# Patient Record
Sex: Female | Born: 1975 | Race: Black or African American | Hispanic: No | Marital: Single | Smoking: Current some day smoker
Health system: Southern US, Community
[De-identification: ages and names within clinical notes are randomized; demographics above are authoritative.]

## PROBLEM LIST (undated history)

## (undated) DIAGNOSIS — IMO0002 Reserved for concepts with insufficient information to code with codable children: Secondary | ICD-10-CM

## (undated) DIAGNOSIS — F32A Depression, unspecified: Secondary | ICD-10-CM

## (undated) DIAGNOSIS — I10 Essential (primary) hypertension: Secondary | ICD-10-CM

## (undated) DIAGNOSIS — E559 Vitamin D deficiency, unspecified: Secondary | ICD-10-CM

## (undated) DIAGNOSIS — R638 Other symptoms and signs concerning food and fluid intake: Secondary | ICD-10-CM

## (undated) DIAGNOSIS — E119 Type 2 diabetes mellitus without complications: Secondary | ICD-10-CM

## (undated) DIAGNOSIS — K219 Gastro-esophageal reflux disease without esophagitis: Secondary | ICD-10-CM

## (undated) DIAGNOSIS — Z8742 Personal history of other diseases of the female genital tract: Secondary | ICD-10-CM

## (undated) DIAGNOSIS — R011 Cardiac murmur, unspecified: Secondary | ICD-10-CM

## (undated) DIAGNOSIS — R519 Headache, unspecified: Secondary | ICD-10-CM

## (undated) DIAGNOSIS — G4730 Sleep apnea, unspecified: Secondary | ICD-10-CM

## (undated) DIAGNOSIS — R51 Headache: Secondary | ICD-10-CM

## (undated) DIAGNOSIS — E7800 Pure hypercholesterolemia, unspecified: Secondary | ICD-10-CM

## (undated) DIAGNOSIS — Z86718 Personal history of other venous thrombosis and embolism: Secondary | ICD-10-CM

## (undated) DIAGNOSIS — N809 Endometriosis, unspecified: Secondary | ICD-10-CM

## (undated) DIAGNOSIS — F329 Major depressive disorder, single episode, unspecified: Secondary | ICD-10-CM

## (undated) DIAGNOSIS — F3131 Bipolar disorder, current episode depressed, mild: Secondary | ICD-10-CM

## (undated) DIAGNOSIS — F419 Anxiety disorder, unspecified: Secondary | ICD-10-CM

## (undated) DIAGNOSIS — M810 Age-related osteoporosis without current pathological fracture: Secondary | ICD-10-CM

## (undated) DIAGNOSIS — Z86711 Personal history of pulmonary embolism: Secondary | ICD-10-CM

## (undated) DIAGNOSIS — N951 Menopausal and female climacteric states: Secondary | ICD-10-CM

## (undated) DIAGNOSIS — M1990 Unspecified osteoarthritis, unspecified site: Secondary | ICD-10-CM

## (undated) DIAGNOSIS — F319 Bipolar disorder, unspecified: Secondary | ICD-10-CM

## (undated) HISTORY — DX: Age-related osteoporosis without current pathological fracture: M81.0

## (undated) HISTORY — DX: Bipolar disorder, current episode depressed, mild: F31.31

## (undated) HISTORY — DX: Anxiety disorder, unspecified: F41.9

## (undated) HISTORY — PX: ABDOMINAL HYSTERECTOMY: SHX81

## (undated) HISTORY — PX: BREAST SURGERY: SHX581

## (undated) HISTORY — DX: Endometriosis, unspecified: N80.9

## (undated) HISTORY — DX: Bipolar disorder, unspecified: F31.9

## (undated) HISTORY — PX: APPENDECTOMY: SHX54

## (undated) HISTORY — DX: Gastro-esophageal reflux disease without esophagitis: K21.9

## (undated) HISTORY — DX: Type 2 diabetes mellitus without complications: E11.9

## (undated) HISTORY — DX: Essential (primary) hypertension: I10

## (undated) HISTORY — DX: Depression, unspecified: F32.A

## (undated) HISTORY — DX: Vitamin D deficiency, unspecified: E55.9

## (undated) HISTORY — DX: Major depressive disorder, single episode, unspecified: F32.9

## (undated) HISTORY — DX: Personal history of other venous thrombosis and embolism: Z86.718

## (undated) HISTORY — DX: Menopausal and female climacteric states: N95.1

## (undated) HISTORY — DX: Personal history of other diseases of the female genital tract: Z87.42

## (undated) HISTORY — DX: Pure hypercholesterolemia, unspecified: E78.00

## (undated) HISTORY — DX: Personal history of pulmonary embolism: Z86.711

## (undated) HISTORY — PX: OTHER SURGICAL HISTORY: SHX169

## (undated) HISTORY — DX: Other symptoms and signs concerning food and fluid intake: R63.8

## (undated) HISTORY — DX: Reserved for concepts with insufficient information to code with codable children: IMO0002

---
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
## 2002-01-21 DIAGNOSIS — Z86711 Personal history of pulmonary embolism: Secondary | ICD-10-CM

## 2002-01-21 DIAGNOSIS — Z86718 Personal history of other venous thrombosis and embolism: Secondary | ICD-10-CM

## 2002-01-21 HISTORY — DX: Personal history of other venous thrombosis and embolism: Z86.718

## 2002-01-21 HISTORY — DX: Personal history of pulmonary embolism: Z86.711

## 2007-01-05 ENCOUNTER — Other Ambulatory Visit: Payer: Self-pay

## 2007-01-05 ENCOUNTER — Emergency Department: Payer: Self-pay | Admitting: Emergency Medicine

## 2007-01-09 ENCOUNTER — Ambulatory Visit: Payer: Self-pay | Admitting: Emergency Medicine

## 2007-03-07 ENCOUNTER — Emergency Department: Payer: Self-pay | Admitting: Emergency Medicine

## 2007-07-23 ENCOUNTER — Emergency Department: Payer: Self-pay | Admitting: Emergency Medicine

## 2008-04-12 ENCOUNTER — Emergency Department: Payer: Self-pay | Admitting: Emergency Medicine

## 2008-04-16 ENCOUNTER — Emergency Department: Payer: Self-pay | Admitting: Emergency Medicine

## 2008-04-17 ENCOUNTER — Emergency Department: Payer: Self-pay | Admitting: Emergency Medicine

## 2008-04-19 ENCOUNTER — Emergency Department: Payer: Self-pay | Admitting: Emergency Medicine

## 2008-04-28 ENCOUNTER — Emergency Department: Payer: Self-pay | Admitting: Emergency Medicine

## 2009-01-21 LAB — HM PAP SMEAR: HM Pap smear: NORMAL

## 2009-05-25 ENCOUNTER — Emergency Department: Payer: Self-pay | Admitting: Emergency Medicine

## 2009-06-10 ENCOUNTER — Emergency Department: Payer: Self-pay | Admitting: Emergency Medicine

## 2009-07-09 ENCOUNTER — Emergency Department: Payer: Self-pay | Admitting: Emergency Medicine

## 2009-08-31 ENCOUNTER — Emergency Department: Payer: Self-pay | Admitting: Emergency Medicine

## 2009-09-05 ENCOUNTER — Ambulatory Visit: Payer: Self-pay | Admitting: Nephrology

## 2010-05-30 ENCOUNTER — Emergency Department: Payer: Self-pay | Admitting: *Deleted

## 2010-06-25 ENCOUNTER — Ambulatory Visit: Payer: Self-pay

## 2010-07-22 ENCOUNTER — Ambulatory Visit: Payer: Self-pay

## 2011-06-06 ENCOUNTER — Emergency Department: Payer: Self-pay | Admitting: *Deleted

## 2011-06-06 LAB — BASIC METABOLIC PANEL
BUN: 9 mg/dL (ref 7–18)
Calcium, Total: 9.3 mg/dL (ref 8.5–10.1)
Chloride: 104 mmol/L (ref 98–107)
Co2: 26 mmol/L (ref 21–32)
Creatinine: 0.83 mg/dL (ref 0.60–1.30)
Glucose: 146 mg/dL — ABNORMAL HIGH (ref 65–99)
Potassium: 3.9 mmol/L (ref 3.5–5.1)

## 2011-06-06 LAB — CBC
HCT: 38.2 % (ref 35.0–47.0)
HGB: 12.3 g/dL (ref 12.0–16.0)
MCV: 78 fL — ABNORMAL LOW (ref 80–100)

## 2011-06-06 LAB — CK TOTAL AND CKMB (NOT AT ARMC)
CK, Total: 268 U/L — ABNORMAL HIGH (ref 21–215)
CK-MB: 1.3 ng/mL (ref 0.5–3.6)

## 2011-06-12 ENCOUNTER — Inpatient Hospital Stay: Payer: Self-pay | Admitting: Psychiatry

## 2011-06-12 LAB — ETHANOL
Ethanol %: <0.003 % (ref 0.000–0.080)
Ethanol: <3 mg/dL

## 2011-06-12 LAB — CBC
HGB: 12.3 g/dL (ref 12.0–16.0)
MCH: 24.8 pg — ABNORMAL LOW (ref 26.0–34.0)
MCHC: 32.5 g/dL (ref 32.0–36.0)
RBC: 4.95 10*6/uL (ref 3.80–5.20)
RDW: 14.6 % — ABNORMAL HIGH (ref 11.5–14.5)
WBC: 8.5 10*3/uL (ref 3.6–11.0)

## 2011-06-12 LAB — DRUG SCREEN, URINE
Amphetamines, Ur Screen: NEGATIVE (ref ?–1000)
Barbiturates, Ur Screen: NEGATIVE (ref ?–200)
Benzodiazepine, Ur Scrn: NEGATIVE (ref ?–200)
Cannabinoid 50 Ng, Ur ~~LOC~~: NEGATIVE (ref ?–50)
Cocaine Metabolite,Ur ~~LOC~~: NEGATIVE (ref ?–300)
MDMA (Ecstasy)Ur Screen: NEGATIVE (ref ?–500)
Phencyclidine (PCP) Ur S: NEGATIVE (ref ?–25)
Tricyclic, Ur Screen: NEGATIVE (ref ?–1000)

## 2011-06-12 LAB — COMPREHENSIVE METABOLIC PANEL
Bilirubin,Total: 0.3 mg/dL (ref 0.2–1.0)
Co2: 23 mmol/L (ref 21–32)
Creatinine: 0.73 mg/dL (ref 0.60–1.30)
EGFR (Non-African Amer.): >60
Osmolality: 275 (ref 275–301)
Potassium: 4.1 mmol/L (ref 3.5–5.1)
SGOT(AST): 21 U/L (ref 15–37)
SGPT (ALT): 20 U/L
Total Protein: 8.2 g/dL (ref 6.4–8.2)

## 2011-06-12 LAB — TSH: Thyroid Stimulating Horm: 1.48 u[IU]/mL

## 2011-06-12 LAB — ACETAMINOPHEN LEVEL: Acetaminophen: <2 ug/mL

## 2011-06-13 LAB — FOLATE: Folic Acid: 17.1 ng/mL (ref 3.1–100.0)

## 2011-06-13 LAB — LIPID PANEL
Cholesterol: 172 mg/dL (ref 0–200)
Ldl Cholesterol, Calc: 118 mg/dL — ABNORMAL HIGH (ref 0–100)

## 2011-06-13 LAB — HEMOGLOBIN A1C: Hemoglobin A1C: 6.2 % (ref 4.2–6.3)

## 2011-06-18 LAB — COMPREHENSIVE METABOLIC PANEL
Alkaline Phosphatase: 53 U/L (ref 50–136)
Anion Gap: 9 (ref 7–16)
BUN: 9 mg/dL (ref 7–18)
Bilirubin,Total: 0.2 mg/dL (ref 0.2–1.0)
Calcium, Total: 8.7 mg/dL (ref 8.5–10.1)
Chloride: 104 mmol/L (ref 98–107)
Creatinine: 0.75 mg/dL (ref 0.60–1.30)
EGFR (African American): >60
Glucose: 98 mg/dL (ref 65–99)
Potassium: 3.9 mmol/L (ref 3.5–5.1)
SGPT (ALT): 19 U/L
Sodium: 140 mmol/L (ref 136–145)

## 2011-06-18 LAB — CBC WITH DIFFERENTIAL/PLATELET
Basophil %: 0.4 %
Eosinophil #: 0.1 10*3/uL (ref 0.0–0.7)
HCT: 38.8 % (ref 35.0–47.0)
HGB: 12.4 g/dL (ref 12.0–16.0)
Lymphocyte #: 3.6 10*3/uL (ref 1.0–3.6)
Lymphocyte %: 45.3 %
MCH: 24.6 pg — ABNORMAL LOW (ref 26.0–34.0)
MCV: 77 fL — ABNORMAL LOW (ref 80–100)
Monocyte #: 0.5 x10 3/mm (ref 0.2–0.9)
Neutrophil #: 3.6 10*3/uL (ref 1.4–6.5)
Platelet: 272 10*3/uL (ref 150–440)
RBC: 5.04 10*6/uL (ref 3.80–5.20)

## 2011-10-23 LAB — COMPREHENSIVE METABOLIC PANEL
Alkaline Phosphatase: 70 U/L (ref 50–136)
BUN: 10 mg/dL (ref 7–18)
Calcium, Total: 9 mg/dL (ref 8.5–10.1)
Co2: 21 mmol/L (ref 21–32)
Creatinine: 1 mg/dL (ref 0.60–1.30)
EGFR (Non-African Amer.): >60
Glucose: 105 mg/dL — ABNORMAL HIGH (ref 65–99)
SGOT(AST): 38 U/L — ABNORMAL HIGH (ref 15–37)
SGPT (ALT): 34 U/L (ref 12–78)

## 2011-10-23 LAB — CBC
MCH: 25.2 pg — ABNORMAL LOW (ref 26.0–34.0)
MCHC: 32.4 g/dL (ref 32.0–36.0)
MCV: 78 fL — ABNORMAL LOW (ref 80–100)
Platelet: 276 10*3/uL (ref 150–440)
RDW: 15.1 % — ABNORMAL HIGH (ref 11.5–14.5)
WBC: 11.7 10*3/uL — ABNORMAL HIGH (ref 3.6–11.0)

## 2011-10-23 LAB — TSH: Thyroid Stimulating Horm: 1.77 u[IU]/mL

## 2011-10-24 ENCOUNTER — Inpatient Hospital Stay: Payer: Self-pay | Admitting: Psychiatry

## 2011-10-24 LAB — URINALYSIS, COMPLETE
Bacteria: NONE SEEN
Bilirubin,UR: NEGATIVE
Blood: NEGATIVE
Leukocyte Esterase: NEGATIVE
Nitrite: NEGATIVE
Ph: 5 (ref 4.5–8.0)
Specific Gravity: 1.012 (ref 1.003–1.030)
Squamous Epithelial: NONE SEEN

## 2011-10-24 LAB — ETHANOL
Ethanol %: <0.003 % (ref 0.000–0.080)
Ethanol: <3 mg/dL

## 2011-10-24 LAB — DRUG SCREEN, URINE
Amphetamines, Ur Screen: NEGATIVE (ref ?–1000)
Benzodiazepine, Ur Scrn: NEGATIVE (ref ?–200)
MDMA (Ecstasy)Ur Screen: NEGATIVE (ref ?–500)
Methadone, Ur Screen: NEGATIVE (ref ?–300)
Phencyclidine (PCP) Ur S: NEGATIVE (ref ?–25)
Tricyclic, Ur Screen: NEGATIVE (ref ?–1000)

## 2011-10-25 LAB — FOLATE: Folic Acid: 18.7 ng/mL (ref 3.1–100.0)

## 2011-10-25 LAB — WBC: WBC: 8.4 10*3/uL (ref 3.6–11.0)

## 2011-10-28 LAB — COMPREHENSIVE METABOLIC PANEL
Albumin: 3.3 g/dL — ABNORMAL LOW (ref 3.4–5.0)
Alkaline Phosphatase: 56 U/L (ref 50–136)
Anion Gap: 8 (ref 7–16)
BUN: 9 mg/dL (ref 7–18)
Calcium, Total: 8.9 mg/dL (ref 8.5–10.1)
Co2: 25 mmol/L (ref 21–32)
EGFR (Non-African Amer.): >60
Glucose: 88 mg/dL (ref 65–99)
Osmolality: 281 (ref 275–301)
Potassium: 4.1 mmol/L (ref 3.5–5.1)
SGOT(AST): 20 U/L (ref 15–37)
Sodium: 142 mmol/L (ref 136–145)

## 2011-11-28 ENCOUNTER — Emergency Department: Payer: Self-pay | Admitting: Emergency Medicine

## 2011-11-28 LAB — URINALYSIS, COMPLETE
Bilirubin,UR: NEGATIVE
Glucose,UR: NEGATIVE mg/dL (ref 0–75)
Leukocyte Esterase: NEGATIVE
Protein: NEGATIVE
RBC,UR: NONE SEEN /HPF (ref 0–5)
Squamous Epithelial: 1

## 2012-08-26 DIAGNOSIS — L732 Hidradenitis suppurativa: Secondary | ICD-10-CM | POA: Insufficient documentation

## 2012-09-03 ENCOUNTER — Ambulatory Visit: Payer: Self-pay | Admitting: Family Medicine

## 2012-10-16 ENCOUNTER — Ambulatory Visit: Payer: Self-pay | Admitting: Internal Medicine

## 2012-10-19 ENCOUNTER — Ambulatory Visit: Payer: Self-pay | Admitting: Family Medicine

## 2012-10-23 ENCOUNTER — Ambulatory Visit: Payer: Self-pay | Admitting: Internal Medicine

## 2012-10-23 LAB — CBC CANCER CENTER
Basophil %: 0.4 %
Eosinophil %: 1.3 %
HCT: 37.9 % (ref 35.0–47.0)
HGB: 12.5 g/dL (ref 12.0–16.0)
MCV: 75 fL — ABNORMAL LOW (ref 80–100)
Monocyte #: 0.5 x10 3/mm (ref 0.2–0.9)
Monocyte %: 4.6 %
Neutrophil #: 5.9 x10 3/mm (ref 1.4–6.5)
RBC: 5.05 10*6/uL (ref 3.80–5.20)
RDW: 15.4 % — ABNORMAL HIGH (ref 11.5–14.5)
WBC: 11.7 x10 3/mm — ABNORMAL HIGH (ref 3.6–11.0)

## 2012-10-29 ENCOUNTER — Ambulatory Visit: Payer: Self-pay | Admitting: Family Medicine

## 2012-11-13 ENCOUNTER — Ambulatory Visit: Payer: Self-pay | Admitting: Family Medicine

## 2012-11-13 LAB — CREATININE, SERUM: EGFR (African American): >60

## 2012-11-19 ENCOUNTER — Emergency Department: Payer: Self-pay | Admitting: Emergency Medicine

## 2012-11-20 LAB — COMPREHENSIVE METABOLIC PANEL
Alkaline Phosphatase: 91 U/L (ref 50–136)
Anion Gap: 6 — ABNORMAL LOW (ref 7–16)
Bilirubin,Total: 0.2 mg/dL (ref 0.2–1.0)
Creatinine: 0.72 mg/dL (ref 0.60–1.30)
EGFR (African American): >60
EGFR (Non-African Amer.): >60
Glucose: 163 mg/dL — ABNORMAL HIGH (ref 65–99)
Osmolality: 275 (ref 275–301)
Potassium: 4.4 mmol/L (ref 3.5–5.1)
SGPT (ALT): 24 U/L (ref 12–78)
Sodium: 137 mmol/L (ref 136–145)

## 2012-11-20 LAB — CBC
HGB: 12.4 g/dL (ref 12.0–16.0)
Platelet: 297 10*3/uL (ref 150–440)
RDW: 15.7 % — ABNORMAL HIGH (ref 11.5–14.5)
WBC: 11.9 10*3/uL — ABNORMAL HIGH (ref 3.6–11.0)

## 2012-11-20 LAB — LIPASE, BLOOD: Lipase: 109 U/L (ref 73–393)

## 2012-11-21 ENCOUNTER — Ambulatory Visit: Payer: Self-pay | Admitting: Internal Medicine

## 2012-11-22 ENCOUNTER — Emergency Department: Payer: Self-pay | Admitting: Emergency Medicine

## 2012-11-23 LAB — URINALYSIS, COMPLETE
Bacteria: NONE SEEN
Bilirubin,UR: NEGATIVE
Blood: NEGATIVE
Glucose,UR: NEGATIVE mg/dL (ref 0–75)
Nitrite: NEGATIVE
Ph: 6 (ref 4.5–8.0)
RBC,UR: <1 /HPF (ref 0–5)
Specific Gravity: 1.019 (ref 1.003–1.030)

## 2012-11-23 LAB — COMPREHENSIVE METABOLIC PANEL
Alkaline Phosphatase: 102 U/L (ref 50–136)
Bilirubin,Total: 0.1 mg/dL — ABNORMAL LOW (ref 0.2–1.0)
Calcium, Total: 9.7 mg/dL (ref 8.5–10.1)
EGFR (African American): >60
EGFR (Non-African Amer.): >60
Glucose: 199 mg/dL — ABNORMAL HIGH (ref 65–99)
Osmolality: 277 (ref 275–301)
Potassium: 4 mmol/L (ref 3.5–5.1)
SGOT(AST): 14 U/L — ABNORMAL LOW (ref 15–37)
Sodium: 136 mmol/L (ref 136–145)

## 2012-11-23 LAB — DRUG SCREEN, URINE
Barbiturates, Ur Screen: NEGATIVE (ref ?–200)
Cannabinoid 50 Ng, Ur ~~LOC~~: NEGATIVE (ref ?–50)
Cocaine Metabolite,Ur ~~LOC~~: NEGATIVE (ref ?–300)
Methadone, Ur Screen: NEGATIVE (ref ?–300)
Phencyclidine (PCP) Ur S: NEGATIVE (ref ?–25)
Tricyclic, Ur Screen: NEGATIVE (ref ?–1000)

## 2012-11-23 LAB — CBC
HCT: 37.1 % (ref 35.0–47.0)
HGB: 12.3 g/dL (ref 12.0–16.0)
MCH: 24.5 pg — ABNORMAL LOW (ref 26.0–34.0)
MCHC: 33.1 g/dL (ref 32.0–36.0)
MCV: 74 fL — ABNORMAL LOW (ref 80–100)
RBC: 5.02 10*6/uL (ref 3.80–5.20)

## 2012-11-23 LAB — TSH: Thyroid Stimulating Horm: 2 u[IU]/mL

## 2012-11-23 LAB — ETHANOL: Ethanol %: <0.003 % (ref 0.000–0.080)

## 2012-12-07 ENCOUNTER — Ambulatory Visit: Payer: Self-pay | Admitting: Family Medicine

## 2012-12-08 ENCOUNTER — Ambulatory Visit: Payer: Self-pay | Admitting: Internal Medicine

## 2012-12-15 ENCOUNTER — Ambulatory Visit: Payer: Self-pay

## 2012-12-21 ENCOUNTER — Ambulatory Visit: Payer: Self-pay

## 2012-12-21 ENCOUNTER — Ambulatory Visit: Payer: Self-pay | Admitting: Internal Medicine

## 2012-12-29 ENCOUNTER — Emergency Department: Payer: Self-pay | Admitting: Emergency Medicine

## 2012-12-29 LAB — DRUG SCREEN, URINE
Amphetamines, Ur Screen: NEGATIVE (ref ?–1000)
Barbiturates, Ur Screen: NEGATIVE (ref ?–200)
Benzodiazepine, Ur Scrn: NEGATIVE (ref ?–200)
Cannabinoid 50 Ng, Ur ~~LOC~~: NEGATIVE (ref ?–50)
MDMA (Ecstasy)Ur Screen: NEGATIVE (ref ?–500)
Opiate, Ur Screen: NEGATIVE (ref ?–300)
Phencyclidine (PCP) Ur S: NEGATIVE (ref ?–25)
Tricyclic, Ur Screen: NEGATIVE (ref ?–1000)

## 2012-12-29 LAB — COMPREHENSIVE METABOLIC PANEL
Albumin: 3.7 g/dL (ref 3.4–5.0)
Alkaline Phosphatase: 67 U/L
Anion Gap: 6 — ABNORMAL LOW (ref 7–16)
BUN: 11 mg/dL (ref 7–18)
Bilirubin,Total: 0.3 mg/dL (ref 0.2–1.0)
Calcium, Total: 9.6 mg/dL (ref 8.5–10.1)
Chloride: 105 mmol/L (ref 98–107)
Creatinine: 0.72 mg/dL (ref 0.60–1.30)
EGFR (African American): >60
EGFR (Non-African Amer.): >60
Osmolality: 274 (ref 275–301)
Potassium: 3.9 mmol/L (ref 3.5–5.1)
SGOT(AST): 25 U/L (ref 15–37)
SGPT (ALT): 26 U/L (ref 12–78)
Sodium: 137 mmol/L (ref 136–145)
Total Protein: 8 g/dL (ref 6.4–8.2)

## 2012-12-29 LAB — URINALYSIS, COMPLETE
Bilirubin,UR: NEGATIVE
Glucose,UR: NEGATIVE mg/dL (ref 0–75)
Ketone: NEGATIVE
Leukocyte Esterase: NEGATIVE
Ph: 6 (ref 4.5–8.0)
Protein: NEGATIVE
Specific Gravity: 1.023 (ref 1.003–1.030)
Squamous Epithelial: <1
WBC UR: 1 /HPF (ref 0–5)

## 2012-12-29 LAB — SALICYLATE LEVEL: Salicylates, Serum: <1.7 mg/dL

## 2012-12-29 LAB — CBC
HCT: 38.2 % (ref 35.0–47.0)
MCH: 24 pg — ABNORMAL LOW (ref 26.0–34.0)
MCHC: 32.1 g/dL (ref 32.0–36.0)
Platelet: 311 10*3/uL (ref 150–440)
RBC: 5.11 10*6/uL (ref 3.80–5.20)
RDW: 15.2 % — ABNORMAL HIGH (ref 11.5–14.5)

## 2012-12-29 LAB — ETHANOL: Ethanol: <3 mg/dL

## 2013-02-16 ENCOUNTER — Ambulatory Visit: Payer: Self-pay

## 2013-03-07 ENCOUNTER — Emergency Department: Payer: Self-pay | Admitting: Emergency Medicine

## 2013-03-15 ENCOUNTER — Ambulatory Visit: Payer: Self-pay | Admitting: Internal Medicine

## 2013-03-21 ENCOUNTER — Ambulatory Visit: Payer: Self-pay | Admitting: Internal Medicine

## 2013-05-19 ENCOUNTER — Inpatient Hospital Stay (HOSPITAL_COMMUNITY)
Admission: AD | Admit: 2013-05-19 | Discharge: 2013-05-19 | Discharge status: Against Medical Advice | Payer: Medicaid Other | Source: Ambulatory Visit | Attending: Obstetrics and Gynecology | Admitting: Obstetrics and Gynecology

## 2013-05-19 DIAGNOSIS — R51 Headache: Secondary | ICD-10-CM | POA: Insufficient documentation

## 2013-05-19 DIAGNOSIS — I10 Essential (primary) hypertension: Secondary | ICD-10-CM

## 2013-05-19 DIAGNOSIS — R519 Headache, unspecified: Secondary | ICD-10-CM

## 2013-05-19 MED ORDER — IBUPROFEN 800 MG PO TABS: 800.0000 mg | ORAL_TABLET | Freq: Three times a day (TID) | ORAL | Status: DC | PRN

## 2013-05-19 NOTE — MAU Note (Signed)
Pt reports nausea x 2 days, vomited x 2 yesterday. "migraine" headache x 4 days. Pt reports history of hypertension and B/P's at home 198/107 -170/105

## 2013-05-19 NOTE — MAU Provider Note (Signed)
.   History     CSN: [id number]  Arrival date & time 05/19/13  9629   First Provider Initiated Contact with Patient 05/19/13 2030     Chief Complaint  Patient presents with  . Headache  . Hypertension    HPI HPI: [name] Golden is a 38 y.o. year old female who presents to MAU reporting hypertension and heachache. BPs 160's/100's at home measured w/ husband's cuff. Has not used it before. HA's is on top of head, constant, not the worst of her life. States she has had these before. Wants BP checked to "see if it is in stoke range". HTN followed by PCP. Pt declined any further exam since BP is normal in MAU. On BP meds.   No past medical history on file.  No past surgical history on file.  No family history on file.  History  Substance Use Topics  . Smoking status: Not on file  . Smokeless tobacco: Not on file  . Alcohol Use: Not on file    OB History   No data available      Review of Systems  Constitutional: Negative for fever and chills.  HENT: Negative for congestion and sinus pressure.   Eyes: Negative for photophobia and visual disturbance.  Cardiovascular: Negative for chest pain.  Neurological: Positive for headaches. Negative for dizziness, syncope, speech difficulty, weakness and numbness.  Psychiatric/Behavioral: Negative for confusion.  :   Allergies  Review of patient's allergies indicates not on file.  Home Medications   Current Outpatient Rx  Name  Route  Sig  Dispense  Refill  . ibuprofen (ADVIL,MOTRIN) 800 MG tablet   Oral   Take 1 tablet (800 mg total) by mouth every 8 (eight) hours as needed for headache.   30 tablet   1     BP 129/85  Pulse 93  Temp(Src) 98.6 F (37 C) (Oral)  Resp 20  Ht 5\' 6"  (1.676 m)  Wt 131.543 kg (290 lb)  BMI 46.83 kg/m2  SpO2 99%  LMP 05/19/2013  Physical Exam. Declined  MAU Course  Procedures (including critical care time)  Labs Reviewed - No data to display No results found.   1. Headache    2. Hypertension    MDM  D/C home. Unable fully assess pt condition due to refusal of complete evaluation.  BP stable. No stoke red flags. Stroke precautions. Follow-up Information   Follow up with [facility] Provider. (as soon as possible for hypertension and headaches)       Follow up with [facility]. (As needed in emergencies)    Contact information:   [place] [id number]        Medication List         ibuprofen 800 MG tablet  Commonly known as:  ADVIL,MOTRIN  Take 1 tablet (800 mg total) by mouth every 8 (eight) hours as needed for headache.       [place], CNM 05/20/2013 4:55 AM

## 2013-05-19 NOTE — Discharge Instructions (Signed)
Arterial Hypertension °Arterial hypertension (high blood pressure) is a condition of elevated pressure in your blood vessels. Hypertension over a long period of time is a risk factor for strokes, heart attacks, and heart failure. It is also the leading cause of kidney (renal) failure.  °CAUSES  °· In Adults -- Over 90% of all hypertension has no known cause. This is called essential or primary hypertension. In the other 10% of people with hypertension, the increase in blood pressure is caused by another disorder. This is called secondary hypertension. Important causes of secondary hypertension are: °· Heavy alcohol use. °· Obstructive sleep apnea. °· Hyperaldosterosim (Conn's syndrome). °· Steroid use. °· Chronic kidney failure. °· Hyperparathyroidism. °· Medications. °· Renal artery stenosis. °· Pheochromocytoma. °· Cushing's disease. °· Coarctation of the aorta. °· Scleroderma renal crisis. °· Licorice (in excessive amounts). °· Drugs (cocaine, methamphetamine). °Your caregiver can explain any items above that apply to you. °· In Children -- Secondary hypertension is more common and should always be considered. °· Pregnancy -- Few women of childbearing age have high blood pressure. However, up to 10% of them develop hypertension of pregnancy. Generally, this will not harm the woman. It may be a sign of 3 complications of pregnancy: preeclampsia, HELLP syndrome, and eclampsia. Follow up and control with medication is necessary. °SYMPTOMS  °· This condition normally does not produce any noticeable symptoms. It is usually found during a routine exam. °· Malignant hypertension is a late problem of high blood pressure. It may have the following symptoms: °· Headaches. °· Blurred vision. °· End-organ damage (this means your kidneys, heart, lungs, and other organs are being damaged). °· Stressful situations can increase the blood pressure. If a person with normal blood pressure has their blood pressure go up while being  seen by their caregiver, this is often termed "white coat hypertension." Its importance is not known. It may be related with eventually developing hypertension or complications of hypertension. °· Hypertension is often confused with mental tension, stress, and anxiety. °DIAGNOSIS  °The diagnosis is made by 3 separate blood pressure measurements. They are taken at least 1 week apart from each other. If there is organ damage from hypertension, the diagnosis may be made without repeat measurements. °Hypertension is usually identified by having blood pressure readings: °· Above 140/90 mmHg measured in both arms, at 3 separate times, over a couple weeks. °· Over 130/80 mmHg should be considered a risk factor and may require treatment in patients with diabetes. °Blood pressure readings over 120/80 mmHg are called "pre-hypertension" even in non-diabetic patients. °To get a true blood pressure measurement, use the following guidelines. Be aware of the factors that can alter blood pressure readings. °· Take measurements at least 1 hour after caffeine. °· Take measurements 30 minutes after smoking and without any stress. This is another reason to quit smoking  it raises your blood pressure. °· Use a proper cuff size. Ask your caregiver if you are not sure about your cuff size. °· Most home blood pressure cuffs are automatic. They will measure systolic and diastolic pressures. The systolic pressure is the pressure reading at the start of sounds. Diastolic pressure is the pressure at which the sounds disappear. If you are elderly, measure pressures in multiple postures. Try sitting, lying or standing. °· Sit at rest for a minimum of 5 minutes before taking measurements. °· You should not be on any medications like decongestants. These are found in many cold medications. °· Record your blood pressure readings and review   them with your caregiver. °If you have hypertension: °· Your caregiver may do tests to be sure you do not have  secondary hypertension (see "causes" above). °· Your caregiver may also look for signs of metabolic syndrome. This is also called Syndrome X or Insulin Resistance Syndrome. You may have this syndrome if you have type 2 diabetes, abdominal obesity, and abnormal blood lipids in addition to hypertension. °· Your caregiver will take your medical and family history and perform a physical exam. °· Diagnostic tests may include blood tests (for glucose, cholesterol, potassium, and kidney function), a urinalysis, or an EKG. Other tests may also be necessary depending on your condition. °PREVENTION  °There are important lifestyle issues that you can adopt to reduce your chance of developing hypertension: °· Maintain a normal weight. °· Limit the amount of salt (sodium) in your diet. °· Exercise often. °· Limit alcohol intake. °· Get enough potassium in your diet. Discuss specific advice with your caregiver. °· Follow a DASH diet (dietary approaches to stop hypertension). This diet is rich in fruits, vegetables, and low-fat dairy products, and avoids certain fats. °PROGNOSIS  °Essential hypertension cannot be cured. Lifestyle changes and medical treatment can lower blood pressure and reduce complications. The prognosis of secondary hypertension depends on the underlying cause. Many people whose hypertension is controlled with medicine or lifestyle changes can live a normal, healthy life.  °RISKS AND COMPLICATIONS  °While high blood pressure alone is not an illness, it often requires treatment due to its short- and long-term effects on many organs. Hypertension increases your risk for: °· CVAs or strokes (cerebrovascular accident). °· Heart failure due to chronically high blood pressure (hypertensive cardiomyopathy). °· Heart attack (myocardial infarction). °· Damage to the retina (hypertensive retinopathy). °· Kidney failure (hypertensive nephropathy). °Your caregiver can explain list items above that apply to you. Treatment  of hypertension can significantly reduce the risk of complications. °TREATMENT  °· For overweight patients, weight loss and regular exercise are recommended. Physical fitness lowers blood pressure. °· Mild hypertension is usually treated with diet and exercise. A diet rich in fruits and vegetables, fat-free dairy products, and foods low in fat and salt (sodium) can help lower blood pressure. Decreasing salt intake decreases blood pressure in a 1/3 of people. °· Stop smoking if you are a smoker. °The steps above are highly effective in reducing blood pressure. While these actions are easy to suggest, they are difficult to achieve. Most patients with moderate or severe hypertension end up requiring medications to bring their blood pressure down to a normal level. There are several classes of medications for treatment. Blood pressure pills (antihypertensives) will lower blood pressure by their different actions. Lowering the blood pressure by 10 mmHg may decrease the risk of complications by as much as 25%. °The goal of treatment is effective blood pressure control. This will reduce your risk for complications. Your caregiver will help you determine the best treatment for you according to your lifestyle. What is excellent treatment for one person, may not be for you. °HOME CARE INSTRUCTIONS  °· Do not smoke. °· Follow the lifestyle changes outlined in the "Prevention" section. °· If you are on medications, follow the directions carefully. Blood pressure medications must be taken as prescribed. Skipping doses reduces their benefit. It also puts you at risk for problems. °· Follow up with your caregiver, as directed. °· If you are asked to monitor your blood pressure at home, follow the guidelines in the "Diagnosis" section above. °SEEK MEDICAL CARE   IF:   You think you are having medication side effects.  You have recurrent headaches or lightheadedness.  You have swelling in your ankles.  You have trouble with  your vision.  SEEK IMMEDIATE MEDICAL CARE IF:   You have sudden onset of chest pain or pressure, difficulty breathing, or other symptoms of a heart attack.  You have a severe headache.  You have symptoms of a stroke (such as sudden weakness, difficulty speaking, difficulty walking). MAKE SURE YOU:   Understand these instructions.  Will watch your condition.  Will get help right away if you are not doing well or get worse. Document Released: 01/07/2005 Document Revised: 04/01/2011 Document Reviewed: 08/07/2006 [facility] Patient Information 2014 [place].  Ischemic Stroke A stroke (cerebrovascular accident) is the sudden death of brain tissue. It is a medical emergency. A stroke can cause permanent loss of brain function. This can cause problems with different parts of your body. A transient ischemic attack (TIA) is different because it does not cause permanent damage. A TIA is a short-lived problem of poor blood flow affecting a part of the brain. A TIA is also a serious problem because having a TIA greatly increases the chances of having a stroke. When symptoms first develop, you cannot know if the problem might be a stroke or TIA. CAUSES  A stroke is caused by a decrease of oxygen supply to an area of your brain. It is usually the result of a small blood clot or collection of cholesterol or fat (plaque) that blocks blood flow in the brain. A stroke can also be caused by blocked or damaged carotid arteries.  RISK FACTORS  High blood pressure (hypertension).  High cholesterol.  Diabetes mellitus.  Heart disease.  The build up of plaque in the blood vessels (peripheral artery disease or atherosclerosis).  The build up of plaque in the blood vessels providing blood and oxygen to the brain (carotid artery stenosis).  An abnormal heart rhythm (atrial fibrillation).  Obesity.  Smoking.  Taking oral contraceptives (especially in combination with smoking).  Physical  inactivity.  A diet high in fats, salt (sodium), and calories.  Alcohol use.  Use of illegal drugs (especially cocaine and methamphetamine).  Being African American.  Being over the age of 75.  Family history of stroke.  Previous history of blood clots, stroke, TIA, or heart attack.  Sickle cell disease. SYMPTOMS  These symptoms usually develop suddenly, or may be newly present upon awakening from sleep:  Sudden weakness or numbness of the face, arm, or leg, especially on one side of the body.  Sudden trouble walking or difficulty moving arms or legs.  Sudden confusion.  Sudden personality changes.  Trouble speaking (aphasia) or understanding.  Difficulty swallowing.  Sudden trouble seeing in one or both eyes.  Double vision.  Dizziness.  Loss of balance or coordination.  Sudden severe headache with no known cause.  Trouble reading or writing. DIAGNOSIS  Your caregiver can often determine the presence or absence of a stroke based on your symptoms, history, and physical exam. Computed tomography (CT) of the brain is usually performed to confirm the stroke, determine causes, and determine stroke severity. Other tests may be done to find the cause of the stroke. These tests may include:  Electrocardiography.  Continuous heart monitoring.  Echocardiography.  Carotid ultrasonography.  Magnetic resonance imaging (MRI).  A scan of the brain circulation.  Blood tests. PREVENTION  The risk of a stroke can be decreased by appropriately treating high blood  pressure, high cholesterol, diabetes, heart disease, and obesity and by quitting smoking, limiting alcohol, and staying physically active. TREATMENT  Time is of the essence. It is important to seek treatment within 3 4 hours of the start of symptoms because you may receive a medicine to dissolve the clot (thrombolytic) that cannot be given after that time. Even if you do not know when your symptoms began, get  treatment as soon as possible. After the 4 hour window has passed, treatment may include rest, oxygen, intravenous (IV) fluids, and medicines to thin the blood (anticoagulants). Treatment of stroke depends on the duration, severity, and cause of your symptoms. Medicines and diet may be used to address diabetes, high blood pressure, and other risk factors. Physical, speech, and occupational therapists will assess you and work to improve any functions impaired by the stroke. Measures will be taken to prevent short-term and long-term complications, including infection from breathing foreign material into the lungs (aspiration pneumonia), blood clots in the legs, bedsores, and falls. Rarely, surgery may be needed to remove large blood clots or to open up blocked arteries. HOME CARE INSTRUCTIONS   Take all medicines prescribed by your caregiver. Follow the directions carefully. Medicines may be used to control risk factors for a stroke. Be sure you understand all your medicine instructions.  You may be told to take aspirin or the anticoagulant warfarin. Warfarin needs to be taken exactly as instructed.  Too much and too little warfarin are both dangerous. Too much warfarin increases the risk of bleeding. Too little warfarin continues to allow the risk for blood clots. While taking warfarin, you will need to have regular blood tests to measure your blood clotting time. These blood tests usually include both the PT and INR tests. The PT and INR results allow your caregiver to adjust your dose of warfarin. The dose can change for many reasons. It is critically important that you take warfarin exactly as prescribed, and that you have your PT and INR levels drawn exactly as directed.  Many foods, especially foods high in vitamin K can interfere with warfarin and affect the PT and INR results. Foods high in vitamin K include spinach, kale, broccoli, cabbage, collard and turnip greens, brussels sprouts, peas,  cauliflower, seaweed, and parsley as well as beef and pork liver, green tea, and soybean oil. You should eat a consistent amount of foods high in vitamin K. Avoid major changes in your diet, or notify your caregiver before changing your diet. Arrange a visit with a dietitian to answer your questions.  Many medicines can interfere with warfarin and affect the PT and INR results. You must tell your caregiver about any and all medicines you take, this includes all vitamins and supplements. Be especially cautious with aspirin and anti-inflammatory medicines. Do not take or discontinue any prescribed or over-the-counter medicine except on the advice of your caregiver or pharmacist.  Warfarin can have side effects, such as excessive bruising or bleeding. You will need to hold pressure over cuts for longer than usual. Your caregiver or pharmacist will discuss other potential side effects.  Avoid sports or activities that may cause injury or bleeding.  Be mindful when shaving, flossing your teeth, or handling sharp objects.  Alcohol can change the body's ability to handle warfarin. It is best to avoid alcoholic drinks or consume only very small amounts while taking warfarin. Notify your caregiver if you change your alcohol intake.  Notify your dentist or other caregivers before procedures.  If swallow studies  have determined that your swallowing reflex is present, you should eat healthy foods. A diet that includes 5 or more servings of fruits and vegetables a day may reduce the risk of stroke. Foods may need to be a special consistency (soft or pureed), or small bites may need to be taken in order to avoid aspirating or choking. Certain diets may be prescribed to address high blood pressure, high cholesterol, diabetes, or obesity.  A low-sodium, low-saturated fat, low-trans fat, low-cholesterol diet is recommended to manage high blood pressure.  A low-saturated fat, low-trans fat, low-cholesterol, and  high-fiber diet may control cholesterol levels.  A controlled-carbohydrate, controlled-sugar diet is recommended to manage diabetes.  A reduced-calorie, low-sodium, low-saturated fat, low-trans fat, low-cholesterol diet is recommended to manage obesity.  Maintain a healthy weight.  Stay physically active. It is recommended that you get at least 30 minutes of activity on most or all days.  Do not smoke.  Limit alcohol use even if you are not taking warfarin. Moderate alcohol use is considered to be:  No more than 2 drinks each day for men.  No more than 1 drink each day for nonpregnant women.  Stop drug abuse.  Home safety. A safe home environment is important to reduce the risk of falls. Your caregiver may arrange for specialists to evaluate your home. Having grab bars in the bedroom and bathroom is often important. Your caregiver may arrange for equipment to be used at home, such as raised toilets and a seat for the shower.  Physical, occupational, and speech therapy. Ongoing therapy may be needed to maximize your recovery after a stroke. If you have been advised to use a walker or a cane, use it at all times. Be sure to keep your therapy appointments.  Follow all instructions for follow-up with your caregiver. This is very important. This includes any referrals, physical therapy, rehabilitation, and lab tests. Proper follow up can prevent another stroke from occurring. SEEK MEDICAL CARE IF:  You have personality changes.  You have difficulty swallowing.  You are seeing double.  You have dizziness.  You have a fever.  You have skin breakdown. SEEK IMMEDIATE MEDICAL CARE IF:  Any of these symptoms may represent a serious problem that is an emergency. Do not wait to see if the symptoms will go away. Get medical help right away. Call your local emergency services (911 in [place] Do not drive yourself to the hospital.  You have sudden weakness or numbness of the face, arm, or  leg, especially on one side of the body.  You have sudden trouble walking or difficulty moving arms or legs.  You have sudden confusion.  You have trouble speaking (aphasia) or understanding.  You have sudden trouble seeing in one or both eyes.  You have a loss of balance or coordination.  You have a sudden, severe headache with no known cause.  You have new chest pain or an irregular heartbeat.  You have a partial or total loss of consciousness.   Document Released: 01/07/2005 Document Revised: 09/09/2012 Document Reviewed: 08/18/2011 [facility] Patient Information 2014 [place].  Migraine Headache A migraine headache is an intense, throbbing pain on one or both sides of your head. A migraine can last for 30 minutes to several hours. CAUSES  The exact cause of a migraine headache is not always known. However, a migraine may be caused when nerves in the brain become irritated and release chemicals that cause inflammation. This causes pain. Certain things may also trigger  migraines, such as:  Alcohol.  Smoking.  Stress.  Menstruation.  Aged cheeses.  Foods or drinks that contain nitrates, glutamate, aspartame, or tyramine.  Lack of sleep.  Chocolate.  Caffeine.  Hunger.  Physical exertion.  Fatigue.  Medicines used to treat chest pain (nitroglycerine), birth control pills, estrogen, and some blood pressure medicines. SIGNS AND SYMPTOMS  Pain on one or both sides of your head.  Pulsating or throbbing pain.  Severe pain that prevents daily activities.  Pain that is aggravated by any physical activity.  Nausea, vomiting, or both.  Dizziness.  Pain with exposure to bright lights, loud noises, or activity.  General sensitivity to bright lights, loud noises, or smells. Before you get a migraine, you may get warning signs that a migraine is coming (aura). An aura may include:  Seeing flashing lights.  Seeing bright spots, halos, or zig-zag  lines.  Having tunnel vision or blurred vision.  Having feelings of numbness or tingling.  Having trouble talking.  Having muscle weakness. DIAGNOSIS  A migraine headache is often diagnosed based on:  Symptoms.  Physical exam.  A CT scan or MRI of your head. These imaging tests cannot diagnose migraines, but they can help rule out other causes of headaches. TREATMENT Medicines may be given for pain and nausea. Medicines can also be given to help prevent recurrent migraines.  HOME CARE INSTRUCTIONS  Only take over-the-counter or prescription medicines for pain or discomfort as directed by your health care provider. The use of long-term narcotics is not recommended.  Lie down in a dark, quiet room when you have a migraine.  Keep a journal to find out what may trigger your migraine headaches. For example, write down:  What you eat and drink.  How much sleep you get.  Any change to your diet or medicines.  Limit alcohol consumption.  Quit smoking if you smoke.  Get 7 9 hours of sleep, or as recommended by your health care provider.  Limit stress.  Keep lights dim if bright lights bother you and make your migraines worse. SEEK IMMEDIATE MEDICAL CARE IF:   Your migraine becomes severe.  You have a fever.  You have a stiff neck.  You have vision loss.  You have muscular weakness or loss of muscle control.  You start losing your balance or have trouble walking.  You feel faint or pass out.  You have severe symptoms that are different from your first symptoms. MAKE SURE YOU:   Understand these instructions.  Will watch your condition.  Will get help right away if you are not doing well or get worse. Document Released: 01/07/2005 Document Revised: 10/28/2012 Document Reviewed: 09/14/2012 [facility] Patient Information 2014 [place].

## 2013-05-23 NOTE — MAU Provider Note (Signed)
Attestation of Attending Supervision of Advanced Practitioner: Evaluation and management procedures were performed by the PA/NP/CNM/OB Fellow under my supervision/collaboration. Chart reviewed and agree with management and plan.  Jonnie Kind 05/23/2013 12:31 AM

## 2013-06-07 ENCOUNTER — Emergency Department: Payer: Self-pay | Admitting: Emergency Medicine

## 2013-07-07 ENCOUNTER — Ambulatory Visit: Payer: Self-pay | Admitting: Gastroenterology

## 2013-08-03 ENCOUNTER — Ambulatory Visit: Payer: Self-pay | Admitting: Otolaryngology

## 2014-04-21 ENCOUNTER — Ambulatory Visit
Admit: 2014-04-21 | Disposition: A | Discharge status: Home / Self Care | Payer: Self-pay | Attending: Family Medicine | Admitting: Family Medicine
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
## 2014-05-13 NOTE — Consult Note (Signed)
Brief Consult Note: Diagnosis: mood disorder nos.   Patient was seen by consultant.   Consult note dictated.   Orders entered.   Comments: Psychiatry: Patient seen. Chart reviewed. Patient is currently calm and denies any suicidal ideation or homicidal ideation. She is insightful and lucid. Agrees to follow up treatment. Not psychotic. Patient is homeless and  needs help with living situation. Continue current medications.  Electronic Signatures: Clapacs, Madie Reno (MD)  (Signed 10-Dec-14 15:06)  Authored: Brief Consult Note   Last Updated: 10-Dec-14 15:06 by Gonzella Lex (MD)
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
## 2014-05-26 ENCOUNTER — Other Ambulatory Visit: Payer: Self-pay | Admitting: Family Medicine

## 2014-06-22 ENCOUNTER — Ambulatory Visit: Payer: Self-pay | Admitting: Obstetrics and Gynecology

## 2014-06-27 ENCOUNTER — Encounter: Payer: Medicaid Other | Admitting: Podiatry

## 2014-06-28 NOTE — Progress Notes (Signed)
This encounter was created in error - please disregard.

## 2014-07-04 ENCOUNTER — Ambulatory Visit: Payer: Medicaid Other | Admitting: Podiatry

## 2014-07-18 ENCOUNTER — Ambulatory Visit (INDEPENDENT_AMBULATORY_CARE_PROVIDER_SITE_OTHER): Payer: Medicaid Other

## 2014-07-18 ENCOUNTER — Ambulatory Visit (INDEPENDENT_AMBULATORY_CARE_PROVIDER_SITE_OTHER): Payer: Medicaid Other | Admitting: Podiatry

## 2014-07-18 ENCOUNTER — Encounter: Payer: Self-pay | Admitting: Podiatry

## 2014-07-18 VITALS — BP 121/78 | HR 89 | Resp 17

## 2014-07-18 DIAGNOSIS — E1142 Type 2 diabetes mellitus with diabetic polyneuropathy: Secondary | ICD-10-CM | POA: Diagnosis not present

## 2014-07-18 DIAGNOSIS — M722 Plantar fascial fibromatosis: Secondary | ICD-10-CM

## 2014-07-18 DIAGNOSIS — M79672 Pain in left foot: Secondary | ICD-10-CM

## 2014-07-18 MED ORDER — GABAPENTIN 100 MG PO CAPS: 100.0000 mg | ORAL_CAPSULE | Freq: Three times a day (TID) | ORAL | Status: DC

## 2014-07-18 MED ORDER — MELOXICAM 15 MG PO TABS: 15.0000 mg | ORAL_TABLET | Freq: Every day | ORAL | Status: DC

## 2014-07-18 NOTE — Progress Notes (Signed)
   Subjective:    Patient ID: [name] Golden, female    DOB: 1975/06/07, 39 y.o.   MRN: [id number]  " my feet hurt when I walk . They hurt where my big toe is on both feet."  They have been hurting for months, pt sits a lot during the day because her feet so bad at times.  She tries to rub her feet to help with the pain, and get better shoes but nothing seem to help. Pt is diabetic A1c last was April pt does not remember what it was.   HPI    Review of Systems  Constitutional: Positive for appetite change, fatigue and unexpected weight change.  HENT:       Sinus problems Ringing in ears   Eyes: Positive for pain and itching.  Respiratory: Positive for chest tightness, shortness of breath and wheezing.   Cardiovascular: Positive for chest pain and leg swelling.  Gastrointestinal: Positive for nausea and abdominal pain.       Bloating   Endocrine:       Excessive thirst   Musculoskeletal: Positive for back pain and gait problem.       Joint pain  Muscle pain   Skin:       Open sores   Allergic/Immunologic: Positive for food allergies.  Neurological: Positive for dizziness and weakness.  Hematological:       Slow to heal   Psychiatric/Behavioral: The patient is nervous/anxious.   All other systems reviewed and are negative.      Objective:   Physical Exam: I have reviewed her past medical history medications allergies surgery social history and review of systems. Pulses are palpable bilateral. Neurologic sensorium is intact but due to history seems to be early onset small fiber neuropathy. Degenerative flexors are intact and muscle strength is intact bilateral. Orthopedic evaluation and shows pain on palpation medial calcaneal tubercle right heel. Mild pes planus bilateral. Mild hallux valgus deformity bilateral.          Assessment & Plan:  Assessment: Plantar fasciitis bilateral. Diabetic peripheral neuropathy.  Plan: Injected the right heel. Started her on  diclofenac. Put her in a plantar fascial strapping. Follow up with her in a few weeks. Discussed appropriate shoe gear stretching exercises ice therapy issue here modifications. We discussed the etiology pathology conservative versus surgical therapies. I started her on gabapentin as well. 100 mg 3 times a day she will taper up and increase over the next 3 weeks.

## 2014-07-19 ENCOUNTER — Telehealth: Payer: Self-pay | Admitting: Family Medicine

## 2014-07-19 NOTE — Telephone Encounter (Deleted)
Pt called wants to

## 2014-07-19 NOTE — Telephone Encounter (Signed)
Pt called wants to know if she needs to come in and do fasting labs, she believes she missed an appt to do this. Please call her @ [phone] or [phone]. Thanks.

## 2014-07-21 ENCOUNTER — Telehealth: Payer: Self-pay | Admitting: Family Medicine

## 2014-07-21 NOTE — Telephone Encounter (Signed)
I have left a message for [name] Golden

## 2014-07-21 NOTE — Telephone Encounter (Signed)
I cannot get in to [facility] just now Will check again soon

## 2014-07-26 ENCOUNTER — Other Ambulatory Visit: Payer: Self-pay

## 2014-07-26 NOTE — Telephone Encounter (Signed)
Left message to call.
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
## 2014-07-26 NOTE — Telephone Encounter (Signed)
Patient notified

## 2014-07-26 NOTE — Telephone Encounter (Signed)
I spoke with [name] Golden, patient has no showed 2 appointments and their policy is after 2 no shows, she can not be seen in their clinic.

## 2014-08-29 ENCOUNTER — Telehealth: Payer: Self-pay | Admitting: Family Medicine

## 2014-08-29 NOTE — Telephone Encounter (Signed)
Patient called to schedule an appointment for hernia and she wants to know if she was going to have labs done for it and if so does she needs to be fasting? Please call pt back, thanks.

## 2014-08-29 NOTE — Telephone Encounter (Signed)
Unable to reach patient, when calling, it says I am dialing the number incorrectly. Tried multiple times.

## 2014-09-05 ENCOUNTER — Ambulatory Visit: Payer: Medicaid Other | Admitting: Podiatry

## 2014-09-12 ENCOUNTER — Ambulatory Visit: Payer: Medicaid Other | Admitting: Family Medicine

## 2014-09-14 ENCOUNTER — Ambulatory Visit (INDEPENDENT_AMBULATORY_CARE_PROVIDER_SITE_OTHER): Payer: Medicaid Other | Admitting: Family Medicine

## 2014-09-14 ENCOUNTER — Encounter: Payer: Self-pay | Admitting: Family Medicine

## 2014-09-14 VITALS — BP 132/83 | HR 82 | Temp 99.1°F | Ht 66.75 in | Wt 268.0 lb

## 2014-09-14 DIAGNOSIS — G8929 Other chronic pain: Secondary | ICD-10-CM

## 2014-09-14 DIAGNOSIS — K439 Ventral hernia without obstruction or gangrene: Secondary | ICD-10-CM | POA: Diagnosis not present

## 2014-09-14 DIAGNOSIS — IMO0002 Reserved for concepts with insufficient information to code with codable children: Secondary | ICD-10-CM

## 2014-09-14 DIAGNOSIS — E785 Hyperlipidemia, unspecified: Secondary | ICD-10-CM | POA: Diagnosis not present

## 2014-09-14 DIAGNOSIS — I10 Essential (primary) hypertension: Secondary | ICD-10-CM

## 2014-09-14 DIAGNOSIS — R109 Unspecified abdominal pain: Secondary | ICD-10-CM | POA: Diagnosis not present

## 2014-09-14 DIAGNOSIS — F489 Nonpsychotic mental disorder, unspecified: Secondary | ICD-10-CM

## 2014-09-14 DIAGNOSIS — F3160 Bipolar disorder, current episode mixed, unspecified: Secondary | ICD-10-CM

## 2014-09-14 DIAGNOSIS — E1165 Type 2 diabetes mellitus with hyperglycemia: Secondary | ICD-10-CM

## 2014-09-14 DIAGNOSIS — Z720 Tobacco use: Secondary | ICD-10-CM

## 2014-09-14 DIAGNOSIS — Z7289 Other problems related to lifestyle: Secondary | ICD-10-CM

## 2014-09-14 DIAGNOSIS — F4310 Post-traumatic stress disorder, unspecified: Secondary | ICD-10-CM

## 2014-09-14 MED ORDER — QUETIAPINE FUMARATE ER 50 MG PO TB24: ORAL_TABLET | ORAL | Status: DC

## 2014-09-14 MED ORDER — AMLODIPINE BESYLATE 2.5 MG PO TABS: 2.5000 mg | ORAL_TABLET | Freq: Every day | ORAL | Status: DC

## 2014-09-14 NOTE — Assessment & Plan Note (Signed)
Patient wants to see how she does with healhy eating and weight loss, off of medicine; she is certainly welcome to try this; we'll check A1C and fasting glucose in about 2-1/2 months (off of meds); weight loss ongoing (intentional), with healthier eating

## 2014-09-14 NOTE — Assessment & Plan Note (Signed)
Previously evaluated by gastroenterologist, Dr. Allen Norris; has several abdominal imaging tests done last year; patient believes current pain is from her hernia, so I'll send her to see surgeon rather than back to Dr. Allen Norris for evaluation

## 2014-09-14 NOTE — Patient Instructions (Addendum)
Please do start back on the Seroquel Use the crisis number if needed Decrease your amlodipine from 5 mg daily to 2.5 mg daily I've put in referrals to psychiatry and surgery If you have not heard anything from my staff in a week about any orders/referrals/studies from today, please contact us here to follow-up [phone] You have my blessing to stay off of the diabetes medicines and we'll recheck your labs after you've been off of those for 3 months Keep up the great job with your weight loss efforts; congratulations on your weight loss to date!

## 2014-09-14 NOTE — Assessment & Plan Note (Signed)
Continue statin; monitor lipids, sgpt at f/u

## 2014-09-14 NOTE — Assessment & Plan Note (Signed)
Better controlled with weight loss; patient asked about stopping med altogether; however, I think 2.5 mg daily of the amlodipine rather than 5 mg periodically will provide smoother coverage; as she continues to lose weight, my hope is that we can stop this altogether

## 2014-09-14 NOTE — Assessment & Plan Note (Signed)
Praised patient for her efforts to date; she has decided to try this on her own rather than see bariatric surgeon at this time

## 2014-09-14 NOTE — Assessment & Plan Note (Signed)
Referral to psychiatrist

## 2014-09-14 NOTE — Assessment & Plan Note (Signed)
With small umbilical hernia I believe; refer to general surgeon for evaluation, treatment; she is working on weight loss

## 2014-09-14 NOTE — Assessment & Plan Note (Signed)
She is down to two cigarettes per day, much improvement; keep trying to quit

## 2014-09-14 NOTE — Progress Notes (Signed)
BP 132/83 mmHg  Pulse 82  Temp(Src) 99.1 F (37.3 C)  Ht 5' 6.75" (1.695 m)  Wt 268 lb (121.564 kg)  BMI 42.31 kg/m2  SpO2 99%  LMP 05/19/2013   Subjective:    Patient ID: [name] Golden, female    DOB: 21-Jan-1976, 39 y.o.   MRN: [id number]  HPI: [name] Golden is a 39 y.o. female  Chief Complaint  Patient presents with  . Hernia  . Anxiety    She admitted she has been cutting herself, it's hard for her to leave the house. Wants to get back on Seroquel.   Her abdominal hernia is bothering a whole lot now; she is not having as much nausea now, but has to eat really small portions; she will use a little salad plate to cut back on her portions; she will fell full faster; hurts to drink water down in the epigastric area; breath is bad even though brushing her teeth; around 2 am, got up to use the restroom, little bits, small amounts of normal soft stool; took her 20 minutes to get a little bit out; got up at 4 am and it started out that way, then firmer, then has to lean or move a certain way to get it to move; then good evacuation after 20 minutes; no blood in the stool; no vomiting but has had some dry heaves on Sunday, nothing coming out except spit after episode of eating; she had the swallow study around early 2015, a year and a half ago; she saw Dr. Allen Golden (GI) for this but at that time, he was more concerned about her liver at the time; not having any acid reflux with this  She is working hard on weight loss; limiting portions, not eating after 8 pm; down from 290 last April to 268 pounds today  She has been out of Seroquel for a couple of months if not more; needs new psychiatrist; she cuts herself when she gets anxiety; does not want to leave her hours; more violent; cuts herself to relieve that; verbally aggressive;   Relevant past medical, surgical, family and social history reviewed and updated as indicated. Interim medical history since our last visit  reviewed. Allergies and medications reviewed and updated.  Review of Systems  Per HPI unless specifically indicated above     Objective:    BP 132/83 mmHg  Pulse 82  Temp(Src) 99.1 F (37.3 C)  Ht 5' 6.75" (1.695 m)  Wt 268 lb (121.564 kg)  BMI 42.31 kg/m2  SpO2 99%  LMP 05/19/2013  Wt Readings from Last 3 Encounters:  09/14/14 268 lb (121.564 kg)  05/19/13 290 lb (131.543 kg)    Physical Exam  Constitutional: She appears well-developed and well-nourished. No distress.  HENT:  Head: Normocephalic and atraumatic.  Eyes: EOM are normal. No scleral icterus.  Cardiovascular: Normal rate.   No murmur heard. Pulmonary/Chest: Effort normal. No respiratory distress. She has no wheezes.  Abdominal: Soft. Bowel sounds are normal. She exhibits no distension and no mass. There is tenderness in the epigastric area and periumbilical area. There is no guarding. A hernia is present. Hernia confirmed positive in the ventral area (and umbilical).  Musculoskeletal: Normal range of motion. She exhibits no edema.  Neurological: She is alert. She exhibits normal muscle tone.  Skin: Skin is warm and dry. Laceration (very shallow marks on the left palm consistent with self-inflicted blade marks; no erythema, no induration, no drainage) noted. She is not diaphoretic. No  pallor.  Psychiatric: She has a normal mood and affect. Her behavior is normal. Judgment and thought content normal.  Very pleasant, good eye contact with examiner      Assessment & Plan:   Problem List Items Addressed This Visit      Cardiovascular and Mediastinum   Essential hypertension, benign    Better controlled with weight loss; patient asked about stopping med altogether; however, I think 2.5 mg daily of the amlodipine rather than 5 mg periodically will provide smoother coverage; as she continues to lose weight, my hope is that we can stop this altogether      Relevant Medications   amLODipine (NORVASC) 2.5 MG tablet      Musculoskeletal and Integument   Deliberate self-cutting    Referral to psychiatrist        Other   Ventral hernia - Primary    With small umbilical hernia I believe; refer to general surgeon for evaluation, treatment; she is working on weight loss      Relevant Orders   Ambulatory referral to General Surgery   Chronic abdominal pain    Previously evaluated by gastroenterologist, Dr. Allen Golden; has several abdominal imaging tests done last year; patient believes current pain is from her hernia, so I'll send her to see surgeon rather than back to Dr. Allen Golden for evaluation      Relevant Orders   Ambulatory referral to General Surgery   Posttraumatic stress disorder    Referral to psychiatrist      Diabetes mellitus type 2, uncontrolled    Patient wants to see how she does with healhy eating and weight loss, off of medicine; she is certainly welcome to try this; we'll check A1C and fasting glucose in about 2-1/2 months (off of meds); weight loss ongoing (intentional), with healthier eating      Hyperlipidemia    Continue statin; monitor lipids, sgpt at f/u      Relevant Medications   amLODipine (NORVASC) 2.5 MG tablet   Morbid obesity    Praised patient for her efforts to date; she has decided to try this on her own rather than see bariatric surgeon at this time      Tobacco abuse    She is down to two cigarettes per day, much improvement; keep trying to quit       Other Visit Diagnoses    Bipolar 1 disorder, mixed        Relevant Orders    Ambulatory referral to Psychiatry        Follow up plan: Return in about 10 weeks (around 11/23/2014) for diabetes, fasting labs.  Meds ordered this encounter  Medications  . DISCONTD: amLODipine (NORVASC) 5 MG tablet    Sig: Take 5 mg by mouth daily.  . QUEtiapine (SEROQUEL XR) 50 MG TB24 24 hr tablet    Sig: One by mouth every night x 1 week, then two at bedtime x 1 week, then three at bedtime x 1 week, then four at bedtime  (200 mg)    Dispense:  70 each    Refill:  0  . amLODipine (NORVASC) 2.5 MG tablet    Sig: Take 1 tablet (2.5 mg total) by mouth daily.    Dispense:  30 tablet    Refill:  3    Pharmacist, we are decreasing this dose

## 2014-09-15 ENCOUNTER — Ambulatory Visit: Payer: Medicaid Other | Admitting: General Surgery

## 2014-09-16 ENCOUNTER — Ambulatory Visit (INDEPENDENT_AMBULATORY_CARE_PROVIDER_SITE_OTHER): Payer: Medicaid Other | Admitting: General Surgery

## 2014-09-16 ENCOUNTER — Encounter: Payer: Self-pay | Admitting: General Surgery

## 2014-09-16 VITALS — BP 122/84 | HR 75 | Temp 99.6°F | Ht 68.0 in | Wt 267.0 lb

## 2014-09-16 DIAGNOSIS — K439 Ventral hernia without obstruction or gangrene: Secondary | ICD-10-CM

## 2014-09-16 NOTE — Progress Notes (Signed)
Surgical Consultation  09/16/2014  [name] Golden is an 39 y.o. female with abdominal pain.   Chief Complaint  Patient presents with  . Hernia    surgical consult     HPI:  39 year old female presents to clinic for evaluation of ongoing abdominal pain. Patient states that she has a multiyear history of abdominal pain that has gradually worsened over the last month. She has had multiple surgeries for unrelated diagnoses mostly endometriosis. Her surgeries have been primarily laparoscopic with the exception of an open appendectomy and C-section. Patient states that her abdominal pain is usually in the midline above her umbilicus however sometime shoots to the right and left. The pain is worse with getting up and down from a laying her seated position and when straining to have a bowel movement. Patient cannot state whether or not there is a reducible bulge. Patient denies any fevers, chills, nausea, vomiting. However she does relate chronic constipation for which she takes multiple laxatives  As treatment. Patient also has a weight problem and has been offered bariatric surgery frozen the past, however she has the desire to forego bariatric surgery at this time. Patient believes that if her abdominal pain was improved that she be a loose the weight on her own. Patient also has psychiatric trichomoniasis history 4 she is on Seroquel for an impending evaluation by psychiatry.  Past Medical History  Diagnosis Date  . Anxiety   . Depression   . Osteoporosis   . Vitamin D deficiency   . Bipolar 1 disorder   . Diabetes   . Heavy periods   . Hypertension   . History of DVT (deep vein thrombosis)   . History of pulmonary embolus (PE)   . Endometriosis   . Painful intercourse   . Painful menstrual periods   . High cholesterol   . GERD (gastroesophageal reflux disease)   . History of ovarian cyst   . Increased BMI (body mass index)   . Menopausal symptom     Past Surgical History   Procedure Laterality Date  . Breast surgery    . Appendectomy    . Abdominal hysterectomy    . Laparoscopic adhesions removal for endometriosis      Family History  Problem Relation Age of Onset  . Diabetes Mother   . Hypertension Mother   . Cancer Mother     cervical  . Hypertension Father   . Cancer Maternal Aunt     pancreatic  . Diabetes Paternal Aunt   . Cancer Paternal Aunt     breast  . Heart disease Paternal Uncle     hole in heart  . Hypertension Maternal Grandmother   . Cancer Maternal Grandfather     lung  . Hypertension Maternal Grandfather   . Heart disease Paternal Grandmother   . Thyroid disease Paternal Grandmother   . Hypertension Paternal Grandmother   . Diabetes Paternal Grandfather   . Stroke Paternal Grandfather   . Hypertension Paternal Grandfather     Social History:  reports that she has been smoking Cigarettes.  She has a 5.75 pack-year smoking history. She has never used smokeless tobacco. She reports that she drinks alcohol. She reports that she does not use illicit drugs.  Allergies:  Allergies  Allergen Reactions  . Allegra [Fexofenadine] Swelling  . Codeine Swelling  . Hydrocodone Swelling  . Clindamycin/Lincomycin   . Penicillins Itching    Medications reviewed.     ROS multisystem review of systems was completed. All  pertinent positives and negatives within the  History of present illness,  theremainder negative     BP 122/84 mmHg  Pulse 75  Temp(Src) 99.6 F (37.6 C) (Oral)  Ht 5\' 8"  (1.727 m)  Wt 121.11 kg (267 lb)  BMI 40.61 kg/m2  LMP 05/19/2013  Physical Exam  Gen.: No acute distress. Neck: Supple with no obvious lymphadenopathy Chest : lung sounds are clear to auscultation in all lung fields Heart : Regular rate and rhythm Abdomen: Soft , nondistended , bowel sounds present in all quadrants. Patient reports tenderness to palpation at the umbilicus and in the midline fascia. There is an obvious umbilical  hernia that is reducible on exam. Although the patient reports tenderness there is no obvious hernia in the upper midline. However, exam is limited due to patient's body habitus   No results found for this or any previous visit (from the past 55 hour(s)). No results found.  Assessment/Plan: 1. Ventral hernia without obstruction or gangrene  39 year old female with an obvious  Umbilical hernia and a question of a ventral hernia. Given the patient's body habitus exam was limited so cross-sectional imaging will be ordered. Meanwhile , an abdominal binder will be provided at the intent for symptomatically while workup is progressing.  Patient to follow up in 2 weeks after cross-sectional imaging.  - CT Abdomen Pelvis W Contrast; Future   Arcelia Jew, MD [facility] General Surgeon [facility] 09/16/2014

## 2014-09-21 ENCOUNTER — Telehealth: Payer: Self-pay

## 2014-09-21 NOTE — Telephone Encounter (Signed)
Called Central Scheduling at this time to find out why Ct has not been scheduled. They have made multiple attempts to contact patient and have been unsuccessful.  Call made to patient, yet again no answer. Unable to leave voicemail.  Call made to both emergency contacts. Left voicemail on both numbers to have patient call us or central scheduling back to get this scheduled.

## 2014-09-23 ENCOUNTER — Telehealth: Payer: Self-pay | Admitting: General Surgery

## 2014-09-23 NOTE — Telephone Encounter (Signed)
Patient has called and stated that she has not been contacted to schedule a CT scan. I have advised the patient per the notes that Central Scheduling has been trying to call to set this up. I have given the patient the number to contact central scheduling to make an appointment. She was advised to call back if she was unsuccessful.

## 2014-09-25 ENCOUNTER — Other Ambulatory Visit: Payer: Self-pay | Admitting: Family Medicine

## 2014-09-27 NOTE — Telephone Encounter (Signed)
Routing to provider  

## 2014-09-28 ENCOUNTER — Ambulatory Visit: Payer: Medicaid Other | Admitting: General Surgery

## 2014-09-29 ENCOUNTER — Ambulatory Visit: Payer: Medicaid Other | Admitting: General Surgery

## 2014-09-30 ENCOUNTER — Ambulatory Visit
Admission: RE | Admit: 2014-09-30 | Discharge: 2014-09-30 | Disposition: A | Discharge status: Home / Self Care | Payer: Medicaid Other | Source: Ambulatory Visit | Attending: General Surgery | Admitting: General Surgery

## 2014-09-30 DIAGNOSIS — K439 Ventral hernia without obstruction or gangrene: Secondary | ICD-10-CM

## 2014-09-30 DIAGNOSIS — N8320 Unspecified ovarian cysts: Secondary | ICD-10-CM | POA: Diagnosis not present

## 2014-09-30 DIAGNOSIS — R109 Unspecified abdominal pain: Secondary | ICD-10-CM | POA: Insufficient documentation

## 2014-09-30 MED ORDER — IOHEXOL 300 MG/ML  SOLN
125.0000 mL | Freq: Once | INTRAMUSCULAR | Status: AC | PRN
  Administered 2014-09-30: 125 mL via INTRAVENOUS

## 2014-10-06 ENCOUNTER — Telehealth: Payer: Self-pay | Admitting: General Surgery

## 2014-10-06 NOTE — Telephone Encounter (Signed)
Returned phone call to patient at this time.   Pt explained that she is having significant bloating and constipation and this is making her Hernia more uncomfortable. She is continuing to wear her abdominal binder to help with pain. Denies Nausea, vomiting, and severe pain.  Last BM was 4 days ago but patient has run out of her stool softener recently. Encouraged patient to get stool softener and take daily to help soften stools. Also, suggested either Dulcolax, Magnesium Citrate, or Milk of Magnesia for constipation as this may help her bloating and discomfort significantly.  She will call back if she is unable to have a BM after using this medications and/or if her pain becomes much worse.

## 2014-10-06 NOTE — Telephone Encounter (Signed)
Had imaging last week and still in discomfort. Bloated very uncomfortable. Please advise

## 2014-10-14 ENCOUNTER — Other Ambulatory Visit: Payer: Self-pay | Admitting: Family Medicine

## 2014-10-14 MED ORDER — QUETIAPINE FUMARATE ER 200 MG PO TB24: 200.0000 mg | ORAL_TABLET | Freq: Every day | ORAL | Status: DC

## 2014-10-14 NOTE — Telephone Encounter (Signed)
Routing to provider  

## 2014-10-14 NOTE — Telephone Encounter (Signed)
She has tapered up from 50 to 200; note received from behavior health asking me to refill, she has not yet gotten in with them; I'm glad to do so

## 2014-10-16 ENCOUNTER — Encounter: Payer: Self-pay | Admitting: Gynecology

## 2014-10-16 ENCOUNTER — Ambulatory Visit
Admission: EM | Admit: 2014-10-16 | Discharge: 2014-10-16 | Disposition: A | Discharge status: Other Acute Inpatient Hospital | Payer: Medicaid Other | Attending: Internal Medicine | Admitting: Internal Medicine

## 2014-10-16 ENCOUNTER — Encounter: Payer: Self-pay | Admitting: Emergency Medicine

## 2014-10-16 ENCOUNTER — Emergency Department
Admission: EM | Admit: 2014-10-16 | Discharge: 2014-10-16 | Disposition: A | Discharge status: Home / Self Care | Payer: Medicaid Other | Attending: Emergency Medicine | Admitting: Emergency Medicine

## 2014-10-16 DIAGNOSIS — S61051A Open bite of right thumb without damage to nail, initial encounter: Secondary | ICD-10-CM

## 2014-10-16 DIAGNOSIS — I10 Essential (primary) hypertension: Secondary | ICD-10-CM | POA: Insufficient documentation

## 2014-10-16 DIAGNOSIS — Z880 Allergy status to penicillin: Secondary | ICD-10-CM | POA: Diagnosis not present

## 2014-10-16 DIAGNOSIS — Z720 Tobacco use: Secondary | ICD-10-CM | POA: Insufficient documentation

## 2014-10-16 DIAGNOSIS — Y9389 Activity, other specified: Secondary | ICD-10-CM | POA: Insufficient documentation

## 2014-10-16 DIAGNOSIS — W540XXA Bitten by dog, initial encounter: Secondary | ICD-10-CM

## 2014-10-16 DIAGNOSIS — Y998 Other external cause status: Secondary | ICD-10-CM | POA: Insufficient documentation

## 2014-10-16 DIAGNOSIS — E119 Type 2 diabetes mellitus without complications: Secondary | ICD-10-CM | POA: Diagnosis not present

## 2014-10-16 DIAGNOSIS — Z79899 Other long term (current) drug therapy: Secondary | ICD-10-CM | POA: Insufficient documentation

## 2014-10-16 DIAGNOSIS — Y9289 Other specified places as the place of occurrence of the external cause: Secondary | ICD-10-CM | POA: Insufficient documentation

## 2014-10-16 DIAGNOSIS — Z791 Long term (current) use of non-steroidal anti-inflammatories (NSAID): Secondary | ICD-10-CM | POA: Insufficient documentation

## 2014-10-16 MED ORDER — SULFAMETHOXAZOLE-TRIMETHOPRIM 800-160 MG PO TABS
1.0000 | ORAL_TABLET | Freq: Once | ORAL | Status: AC
  Administered 2014-10-16: 1 via ORAL
  Filled 2014-10-16: qty 1

## 2014-10-16 MED ORDER — RABIES IMMUNE GLOBULIN 150 UNIT/ML IM INJ: 20.0000 [IU]/kg | INJECTION | Freq: Once | INTRAMUSCULAR | Status: DC

## 2014-10-16 MED ORDER — SULFAMETHOXAZOLE-TRIMETHOPRIM 800-160 MG PO TABS: 1.0000 | ORAL_TABLET | Freq: Two times a day (BID) | ORAL | Status: DC

## 2014-10-16 MED ORDER — RABIES VACCINE, PCEC IM SUSR
1.0000 mL | Freq: Once | INTRAMUSCULAR | Status: DC
  Filled 2014-10-16: qty 1

## 2014-10-16 NOTE — Discharge Instructions (Signed)
Animal Bite °An animal bite can result in a scratch on the skin, deep open cut, puncture of the skin, crush injury, or tearing away of the skin or a body part. Dogs are responsible for most animal bites. Children are bitten more often than adults. An animal bite can range from very mild to more serious. A small bite from your house pet is no cause for alarm. However, some animal bites can become infected or injure a bone or other tissue. You must seek medical care if: °· The skin is broken and bleeding does not slow down or stop after 15 minutes. °· The puncture is deep and difficult to clean (such as a cat bite). °· Pain, warmth, redness, or pus develops around the wound. °· The bite is from a stray animal or rodent. There may be a risk of rabies infection. °· The bite is from a snake, raccoon, skunk, fox, coyote, or bat. There may be a risk of rabies infection. °· The person bitten has a chronic illness such as diabetes, liver disease, or cancer, or the person takes medicine that lowers the immune system. °· There is concern about the location and severity of the bite. °It is important to clean and protect an animal bite wound right away to prevent infection. Follow these steps: °· Clean the wound with plenty of water and soap. °· Apply an antibiotic cream. °· Apply gentle pressure over the wound with a clean towel or gauze to slow or stop bleeding. °· Elevate the affected area above the heart to help stop any bleeding. °· Seek medical care. Getting medical care within 8 hours of the animal bite leads to the best possible outcome. °DIAGNOSIS  °Your caregiver will most likely: °· Take a detailed history of the animal and the bite injury. °· Perform a wound exam. °· Take your medical history. °Blood tests or X-rays may be performed. Sometimes, infected bite wounds are cultured and sent to a lab to identify the infectious bacteria.  °TREATMENT  °Medical treatment will depend on the location and type of animal bite as  well as the patient's medical history. Treatment may include: °· Wound care, such as cleaning and flushing the wound with saline solution, bandaging, and elevating the affected area. °· Antibiotics. °· Tetanus immunization. °· Rabies immunization. °· Leaving the wound open to heal. This is often done with animal bites, due to the high risk of infection. However, in certain cases, wound closure with stitches, wound adhesive, skin adhesive strips, or staples may be used. ° Infected bites that are left untreated may require intravenous (IV) antibiotics and surgical treatment in the hospital. °HOME CARE INSTRUCTIONS °· Follow your caregiver's instructions for wound care. °· Take all medicines as directed. °· If your caregiver prescribes antibiotics, take them as directed. Finish them even if you start to feel better. °· Follow up with your caregiver for further exams or immunizations as directed. °You may need a tetanus shot if: °· You cannot remember when you had your last tetanus shot. °· You have never had a tetanus shot. °· The injury broke your skin. °If you get a tetanus shot, your arm may swell, get red, and feel warm to the touch. This is common and not a problem. If you need a tetanus shot and you choose not to have one, there is a rare chance of getting tetanus. Sickness from tetanus can be serious. °SEEK MEDICAL CARE IF: °· You notice warmth, redness, soreness, swelling, pus discharge, or a bad   smell coming from the wound.  You have a red line on the skin coming from the wound.  You have a fever, chills, or a general ill feeling.  You have nausea or vomiting.  You have continued or worsening pain.  You have trouble moving the injured part.  You have other questions or concerns. MAKE SURE YOU:  Understand these instructions.  Will watch your condition.  Will get help right away if you are not doing well or get worse. Document Released: 09/25/2010 Document Revised: 04/01/2011 Document  Reviewed: 09/25/2010 [facility] Patient Information 2015 [place], [place]. This information is not intended to replace advice given to you by your health care provider. Make sure you discuss any questions you have with your health care provider.   Follow-up with the owner and determine the dog's vaccine status.  Animal control will determine if a 10-day quarantine is needed. Return as needed for rabies vaccination as discussed.

## 2014-10-16 NOTE — ED Notes (Signed)
Pt presents to the ER from Urgent Care in [place] to get rabies vaccine after being bit by pit bull yesterday, pt reports [employer] took report.

## 2014-10-16 NOTE — ED Provider Notes (Signed)
[facility] Emergency Department Provider Note  ____________________________________________  Time seen: Approximately 4:26 PM  I have reviewed the triage vital signs and the nursing notes.   HISTORY  Chief Complaint Animal Bite   HPI [name] Golden is a 39 y.o. female  presents with a complaint of dog bite. Patient reports that yesterday she was at her apartment complex and reports a neighbor had fallen and reports that EMS was on the scene reports that in the midst of this commotion the neighbors dog ran at her and bit her right thumb. States that the dog tried to bite her multiple times but only bit her once on the right thumb. Patient reports her tetanus immunization is up-to-date. 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  Patient reports that she did not notify animal control or police. RN notified animal control. Patient reports concern due to needing rabies immunization. Patient reports she is concerned with this as the owner was a elderly woman who just recently moved into the complex and the dog did not have up-to-date rabies immunization tags on his collar. Describes the dog as a pitbull mix. States that she did not provoke at this attack. States attacked occurred yesterday approximately 7pm.    Past Medical History  Diagnosis Date  . Anxiety   . Depression   . Osteoporosis   . Vitamin D deficiency   . Bipolar 1 disorder   . Diabetes   . Heavy periods   . Hypertension   . History of DVT (deep vein thrombosis)   . History of pulmonary embolus (PE)   . Endometriosis   . Painful intercourse   . Painful menstrual periods   . High cholesterol   . GERD (gastroesophageal reflux disease)   . History of ovarian cyst   . Increased BMI (body mass index)   . Menopausal symptom     Patient Active Problem List   Diagnosis Date Noted  . Ventral hernia 09/14/2014  . Chronic abdominal pain 09/14/2014  . Posttraumatic stress disorder  09/14/2014  . Diabetes mellitus type 2, uncontrolled 09/14/2014  . Hyperlipidemia 09/14/2014  . Morbid obesity 09/14/2014  . Essential hypertension, benign 09/14/2014  . Tobacco abuse 09/14/2014  . Deliberate self-cutting 09/14/2014    Past Surgical History  Procedure Laterality Date  . Breast surgery    . Appendectomy    . Abdominal hysterectomy    . Laparoscopic adhesions removal for endometriosis      Current Outpatient Rx  Name  Route  Sig  Dispense  Refill  . amLODipine (NORVASC) 2.5 MG tablet   Oral   Take 1 tablet (2.5 mg total) by mouth daily.   30 tablet   3     Pharmacist, we are decreasing this dose   . atorvastatin (LIPITOR) 10 MG tablet      TAKE 1 TABLET BY MOUTH AT BEDTIME FOR CHOLESTEROL   30 tablet   2   . gabapentin (NEURONTIN) 100 MG capsule   Oral   Take 1 capsule (100 mg total) by mouth 3 (three) times daily.   90 capsule   3   . meloxicam (MOBIC) 15 MG tablet   Oral   Take 1 tablet (15 mg total) by mouth daily.   30 tablet   3   . QUEtiapine (SEROQUEL XR) 200 MG 24 hr tablet   Oral   Take 1 tablet (200 mg total) by mouth at bedtime.   30 tablet   1   .  polyethylene glycol (MIRALAX / GLYCOLAX) packet   Oral   Take 17 g by mouth daily as needed.           Allergies Allegra; Codeine; Hydrocodone; Clindamycin/lincomycin; and Penicillins  Family History  Problem Relation Age of Onset  . Diabetes Mother   . Hypertension Mother   . Cancer Mother     cervical  . Hypertension Father   . Cancer Maternal Aunt     pancreatic  . Diabetes Paternal Aunt   . Cancer Paternal Aunt     breast  . Heart disease Paternal Uncle     hole in heart  . Hypertension Maternal Grandmother   . Cancer Maternal Grandfather     lung  . Hypertension Maternal Grandfather   . Heart disease Paternal Grandmother   . Thyroid disease Paternal Grandmother   . Hypertension Paternal Grandmother   . Diabetes Paternal Grandfather   . Stroke Paternal  Grandfather   . Hypertension Paternal Grandfather     Social History Social History  Substance Use Topics  . Smoking status: Current Every Day Smoker -- 0.25 packs/day for 23 years    Types: Cigarettes  . Smokeless tobacco: Never Used  . Alcohol Use: 0.0 oz/week    0 Standard drinks or equivalent per week     Comment: Occasional    Review of Systems Constitutional: No fever/chills Eyes: No visual changes. ENT: No sore throat. Cardiovascular: Denies chest pain. Respiratory: Denies shortness of breath. Gastrointestinal: No abdominal pain.  No nausea, no vomiting.  No diarrhea.  No constipation. Genitourinary: Negative for dysuria. Musculoskeletal: Negative for back pain.  Skin: Negative for rash. Dog bit to right thumb Neurological: Negative for headaches, focal weakness or numbness.  10-point ROS otherwise negative.  ____________________________________________   PHYSICAL EXAM:  VITAL SIGNS: ED Triage Vitals  Enc Vitals Group     BP 10/16/14 1506 142/84 mmHg     Pulse Rate 10/16/14 1506 80     Resp 10/16/14 1506 20     Temp 10/16/14 1506 98.9 F (37.2 C)     Temp Source 10/16/14 1506 Oral     SpO2 10/16/14 1506 98 %     Weight 10/16/14 1506 271 lb (122.925 kg)     Height 10/16/14 1506 5\' 6"  (1.676 m)     Head Cir --      Peak Flow --      Pain Score 10/16/14 1511 2     Pain Loc --      Pain Edu? --      Excl. in [place]? --     Constitutional: Alert and oriented. Well appearing and in no acute distress. Eyes: Conjunctivae are normal. PERRL. EOMI. Head: Atraumatic.  Nose: No congestion/rhinnorhea.  Mouth/Throat: Mucous membranes are moist.  Oropharynx non-erythematous. Neck: No stridor.  No cervical spine tenderness to palpation.  Hematological/Lymphatic/Immunilogical: No cervical lymphadenopathy. Cardiovascular: Normal rate, regular rhythm. Grossly normal heart sounds.  Good peripheral circulation. Respiratory: Normal respiratory effort.  No retractions. Lungs  CTAB. Gastrointestinal: Soft and nontender. Obese abdomen.  Musculoskeletal: No lower or upper extremity tenderness nor edema.   Neurologic:  Normal speech and language. No gross focal neurologic deficits are appreciated. No gait instability. Skin:  Skin is warm, dry and intact. No rash noted. Except: Right distal lateral thumb lateral to base of nail superficial healing puncture wound noted, with minimal to mild erythema at site, mild TTP, full ROM, no tendon or motor deficit, sensation intact, cap refill <2 secs.  Psychiatric: Mood and affect are normal. Speech and behavior are normal.  ____________________________________________   LABS (all labs ordered are listed, but only abnormal results are displayed)  Labs Reviewed - No data to display   INITIAL IMPRESSION / ASSESSMENT AND PLAN / ED COURSE  Pertinent labs & imaging results that were available during my care of the patient were reviewed by me and considered in my medical decision making (see chart for details).  Very well-appearing patient. No acute distress. Presents for complaints of dog bite. Patient with superficial healing puncture wound to lateral nail right thumb. Minimal erythema surrounding puncture site. Nontender, full range of motion their tendon or motor deficits. Sensation intact. Long conversation with patient regarding rabies immunizations.  Discussed with patient that we would contact animal control and had animal control investigate the situation to see if dog is up-to-date on immunizations and that animal control would like have dog quarantined for monitoring. RN notified animal control. However patient reports she is unsure of what happened to the dog after this situation, concerned if dog will be found, as well as she expresses concern that the neighbor may have been hospitalized and will not be a reliable source for if dog is up-to-date on immunizations or not. It sounds as if this dog is a household pet that  lives inside. Likely a low risk rabies exposure, however patient voices concern and states she wants to proceed and have rabies immunizations initiated. Discussed with patient, unable to begin initial rabies immunizations at this facility. Patient reports she will go to [facility] ER for rabies immunizations. Counseled regarding cleaning and monitoring wound. Discussed follow up with Primary care physician this week. Discussed follow up and return parameters including no resolution or any worsening concerns. Patient verbalized understanding and agreed to plan.     ____________________________________________   FINAL CLINICAL IMPRESSION(S) / ED DIAGNOSES  Final diagnoses:  Dog bite of right thumb, initial encounter       Marylene Land, NP 10/16/14 1759  Marylene Land, NP 10/16/14 1800

## 2014-10-16 NOTE — Discharge Instructions (Signed)

## 2014-10-16 NOTE — ED Provider Notes (Signed)
[facility] Emergency Department [name] Golden Note ____________________________________________  Time seen: 1836  I have reviewed the triage vital signs and the nursing notes.  HISTORY  Chief Complaint  Rabies Injection and Animal Bite  HPI [name] Golden is a 39 y.o. female reports to the ED from the [facility] (see Ersel Wadleigh note), after evaluation and counseling following a dog bite yesterday.She sustained a dog bite to the right thumb, by her neighbor's dog after she went next door to attend to the neighbor that had fallen. The dog was reportedly excited and agitated, and did not allow EMS some point to make contact with the following neighbor. The patient presents today, following the advice of the Eldean Klatt at [facility] urgent care, who after extensive counseling, suggested that the patient reported here for the rabies vaccine that she requested. The patient admittedly is anxious, and concern for development of rabies. She reports a concern due to a depressed immune system. She however has not been able to make contact with the neighbor to determine the vaccine status of this otherwise healthy, indoor, pitbull mix.  Past Medical History  Diagnosis Date  . Anxiety   . Depression   . Osteoporosis   . Vitamin D deficiency   . Bipolar 1 disorder   . Diabetes   . Heavy periods   . Hypertension   . History of DVT (deep vein thrombosis)   . History of pulmonary embolus (PE)   . Endometriosis   . Painful intercourse   . Painful menstrual periods   . High cholesterol   . GERD (gastroesophageal reflux disease)   . History of ovarian cyst   . Increased BMI (body mass index)   . Menopausal symptom     Patient Active Problem List   Diagnosis Date Noted  . Ventral hernia 09/14/2014  . Chronic abdominal pain 09/14/2014  . Posttraumatic stress disorder 09/14/2014  . Diabetes mellitus type 2, uncontrolled 09/14/2014  . Hyperlipidemia 09/14/2014  . Morbid  obesity 09/14/2014  . Essential hypertension, benign 09/14/2014  . Tobacco abuse 09/14/2014  . Deliberate self-cutting 09/14/2014    Past Surgical History  Procedure Laterality Date  . Breast surgery    . Appendectomy    . Abdominal hysterectomy    . Laparoscopic adhesions removal for endometriosis      Current Outpatient Rx  Name  Route  Sig  Dispense  Refill  . amLODipine (NORVASC) 2.5 MG tablet   Oral   Take 1 tablet (2.5 mg total) by mouth daily.   30 tablet   3     Pharmacist, we are decreasing this dose   . atorvastatin (LIPITOR) 10 MG tablet      TAKE 1 TABLET BY MOUTH AT BEDTIME FOR CHOLESTEROL   30 tablet   2   . gabapentin (NEURONTIN) 100 MG capsule   Oral   Take 1 capsule (100 mg total) by mouth 3 (three) times daily.   90 capsule   3   . meloxicam (MOBIC) 15 MG tablet   Oral   Take 1 tablet (15 mg total) by mouth daily.   30 tablet   3   . polyethylene glycol (MIRALAX / GLYCOLAX) packet   Oral   Take 17 g by mouth daily as needed.         Marland Kitchen QUEtiapine (SEROQUEL XR) 200 MG 24 hr tablet   Oral   Take 1 tablet (200 mg total) by mouth at bedtime.   30 tablet  1   . sulfamethoxazole-trimethoprim (BACTRIM DS,SEPTRA DS) 800-160 MG per tablet   Oral   Take 1 tablet by mouth 2 (two) times daily.   19 tablet   0    Allergies Allegra; Codeine; Hydrocodone; Clindamycin/lincomycin; and Penicillins  Family History  Problem Relation Age of Onset  . Diabetes Mother   . Hypertension Mother   . Cancer Mother     cervical  . Hypertension Father   . Cancer Maternal Aunt     pancreatic  . Diabetes Paternal Aunt   . Cancer Paternal Aunt     breast  . Heart disease Paternal Uncle     hole in heart  . Hypertension Maternal Grandmother   . Cancer Maternal Grandfather     lung  . Hypertension Maternal Grandfather   . Heart disease Paternal Grandmother   . Thyroid disease Paternal Grandmother   . Hypertension Paternal Grandmother   . Diabetes  Paternal Grandfather   . Stroke Paternal Grandfather   . Hypertension Paternal Grandfather    Social History Social History  Substance Use Topics  . Smoking status: Current Every Day Smoker -- 0.25 packs/day for 23 years    Types: Cigarettes  . Smokeless tobacco: Never Used  . Alcohol Use: 0.0 oz/week    0 Standard drinks or equivalent per week     Comment: Occasional   Review of Systems  Constitutional: Negative for fever. Eyes: Negative for visual changes. ENT: Negative for sore throat. Cardiovascular: Negative for chest pain. Respiratory: Negative for shortness of breath. Gastrointestinal: Negative for abdominal pain, vomiting and diarrhea. Genitourinary: Negative for dysuria. Musculoskeletal: Negative for back pain. Skin: Negative for rash. Dog bite to the right thumb Neurological: Negative for headaches, focal weakness or numbness. ____________________________________________  PHYSICAL EXAM:  VITAL SIGNS: ED Triage Vitals  Enc Vitals Group     BP 10/16/14 1818 147/82 mmHg     Pulse Rate 10/16/14 1818 79     Resp 10/16/14 1818 20     Temp 10/16/14 1818 98.2 F (36.8 C)     Temp Source 10/16/14 1818 Oral     SpO2 10/16/14 1818 96 %     Weight 10/16/14 1818 271 lb (122.925 kg)     Height 10/16/14 1818 5\' 7"  (1.702 m)     Head Cir --      Peak Flow --      Pain Score --      Pain Loc --      Pain Edu? --      Excl. in [place]? --     Constitutional: Alert and oriented. Well appearing and in no distress. Eyes: Conjunctivae are normal. PERRL. Normal extraocular movements. ENT   Head: Normocephalic and atraumatic.   Nose: No congestion/rhinorrhea.   Mouth/Throat: Mucous membranes are moist.   Neck: Supple. No thyromegaly. Hematological/Lymphatic/Immunological: No cervical lymphadenopathy. Cardiovascular: Normal rate, regular rhythm.  Respiratory: Normal respiratory effort. No wheezes/rales/rhonchi. Gastrointestinal: Soft and nontender. No  distention. Musculoskeletal: Nontender with normal range of motion in all extremities.  Neurologic:  Normal gait without ataxia. Normal speech and language. No gross focal neurologic deficits are appreciated. Skin:  Skin is warm, dry and intact. No rash noted. Psychiatric: Mood and affect are normal. Patient exhibits appropriate insight and judgment. ____________________________________________  PROCEDURES  Bactrim DS 1 PO ____________________________________________  INITIAL IMPRESSION / ASSESSMENT AND PLAN / ED COURSE  Extensive counseling with the patient again regarding the transmission, progression, and management of the rabies virus. The patient is advised that  she does not have to emergently receive the vaccine, given the extremely low risk that this domesticated family pet, was infected. The bite appears to be a provoked in so far as the dog was being protective of its owner and territory. Patient advises that she will wait one day, and may contact with the neighbor, to determine the dog's vaccine status. She will return to the ED tomorrow for rabies vaccine if indicated. Patient was encouraged to ask questions for understanding, and verbalizes that she is aware that there is no extreme urgency receiving the vaccine in this particular situation. ____________________________________________  FINAL CLINICAL IMPRESSION(S) / ED DIAGNOSES  Final diagnoses:  Dog bite      Melvenia Needles, PA-C 10/17/14 0050  Eula Listen, MD 10/28/14 [id number]

## 2014-10-16 NOTE — ED Notes (Signed)
Patient stated got bitten on left thumb by a mixed pitt bull dog. Pt. Stated happen at apartment complex while helping the dog owner you had fallen yesterday.

## 2014-10-17 ENCOUNTER — Telehealth: Payer: Self-pay

## 2014-10-17 ENCOUNTER — Encounter: Payer: Self-pay | Admitting: Emergency Medicine

## 2014-10-17 ENCOUNTER — Emergency Department
Admission: EM | Admit: 2014-10-17 | Discharge: 2014-10-17 | Disposition: A | Discharge status: Home / Self Care | Payer: Medicaid Other | Attending: Emergency Medicine | Admitting: Emergency Medicine

## 2014-10-17 DIAGNOSIS — Z203 Contact with and (suspected) exposure to rabies: Secondary | ICD-10-CM | POA: Insufficient documentation

## 2014-10-17 DIAGNOSIS — Z880 Allergy status to penicillin: Secondary | ICD-10-CM | POA: Insufficient documentation

## 2014-10-17 DIAGNOSIS — Z791 Long term (current) use of non-steroidal anti-inflammatories (NSAID): Secondary | ICD-10-CM | POA: Diagnosis not present

## 2014-10-17 DIAGNOSIS — Z720 Tobacco use: Secondary | ICD-10-CM | POA: Insufficient documentation

## 2014-10-17 DIAGNOSIS — Z79899 Other long term (current) drug therapy: Secondary | ICD-10-CM | POA: Diagnosis not present

## 2014-10-17 DIAGNOSIS — Z23 Encounter for immunization: Secondary | ICD-10-CM | POA: Insufficient documentation

## 2014-10-17 DIAGNOSIS — E119 Type 2 diabetes mellitus without complications: Secondary | ICD-10-CM | POA: Insufficient documentation

## 2014-10-17 DIAGNOSIS — I10 Essential (primary) hypertension: Secondary | ICD-10-CM | POA: Diagnosis not present

## 2014-10-17 MED ORDER — RABIES IMMUNE GLOBULIN 150 UNIT/ML IM INJ
20.0000 [IU]/kg | INJECTION | Freq: Once | INTRAMUSCULAR | Status: AC
  Administered 2014-10-17: 2475 [IU] via INTRAMUSCULAR
  Filled 2014-10-17: qty 16.5

## 2014-10-17 MED ORDER — RABIES VACCINE, PCEC IM SUSR
1.0000 mL | Freq: Once | INTRAMUSCULAR | Status: AC
  Administered 2014-10-17: 1 mL via INTRAMUSCULAR
  Filled 2014-10-17: qty 1

## 2014-10-17 NOTE — Telephone Encounter (Signed)
Advised her that her TDap was up to date. She did go to the urgent care and ED over the weekend. She is going to go ahead and get the rabies vaccines.

## 2014-10-17 NOTE — ED Provider Notes (Signed)
[facility] Emergency Department Provider Note ____________________________________________  Time seen: 1715  I have reviewed the triage vital signs and the nursing notes.  HISTORY  Chief Complaint  Rabies Injection  HPI [name] Golden is a 39 y.o. female reports to the ED for initiation of the rabies vaccine as well as rabies immunoglobulin. She reports overall that she was unsatisfied with the information received related to the vaccine status of the dog.She intends to complete the Bactrim that was previously prescribed, and overall reports no fever, chills, sweats, or right thumb pain.  Past Medical History  Diagnosis Date  . Anxiety   . Depression   . Osteoporosis   . Vitamin D deficiency   . Bipolar 1 disorder   . Diabetes   . Heavy periods   . Hypertension   . History of DVT (deep vein thrombosis)   . History of pulmonary embolus (PE)   . Endometriosis   . Painful intercourse   . Painful menstrual periods   . High cholesterol   . GERD (gastroesophageal reflux disease)   . History of ovarian cyst   . Increased BMI (body mass index)   . Menopausal symptom     Patient Active Problem List   Diagnosis Date Noted  . Ventral hernia 09/14/2014  . Chronic abdominal pain 09/14/2014  . Posttraumatic stress disorder 09/14/2014  . Diabetes mellitus type 2, uncontrolled 09/14/2014  . Hyperlipidemia 09/14/2014  . Morbid obesity 09/14/2014  . Essential hypertension, benign 09/14/2014  . Tobacco abuse 09/14/2014  . Deliberate self-cutting 09/14/2014  . Acne inversa 08/26/2012    Past Surgical History  Procedure Laterality Date  . Breast surgery    . Appendectomy    . Abdominal hysterectomy    . Laparoscopic adhesions removal for endometriosis      Current Outpatient Rx  Name  Route  Sig  Dispense  Refill  . amLODipine (NORVASC) 2.5 MG tablet   Oral   Take 1 tablet (2.5 mg total) by mouth daily.   30 tablet   3     Pharmacist, we  are decreasing this dose   . amLODipine (NORVASC) 5 MG tablet   Oral   Take 5 mg by mouth daily.      6   . atorvastatin (LIPITOR) 10 MG tablet      TAKE 1 TABLET BY MOUTH AT BEDTIME FOR CHOLESTEROL   30 tablet   2   . gabapentin (NEURONTIN) 100 MG capsule   Oral   Take 1 capsule (100 mg total) by mouth 3 (three) times daily.   90 capsule   3   . JANUVIA 100 MG tablet   Oral   Take 100 mg by mouth daily.      5     Dispense as written.   . meloxicam (MOBIC) 15 MG tablet   Oral   Take 1 tablet (15 mg total) by mouth daily.   30 tablet   3   . polyethylene glycol (MIRALAX / GLYCOLAX) packet   Oral   Take 17 g by mouth daily as needed.         Marland Kitchen QUEtiapine (SEROQUEL XR) 200 MG 24 hr tablet   Oral   Take 1 tablet (200 mg total) by mouth at bedtime.   30 tablet   1   . sulfamethoxazole-trimethoprim (BACTRIM DS,SEPTRA DS) 800-160 MG per tablet   Oral   Take 1 tablet by mouth 2 (two) times daily.   19 tablet  0    Allergies Allegra; Codeine; Hydrocodone; Clindamycin/lincomycin; and Penicillins  Family History  Problem Relation Age of Onset  . Diabetes Mother   . Hypertension Mother   . Cancer Mother     cervical  . Hypertension Father   . Cancer Maternal Aunt     pancreatic  . Diabetes Paternal Aunt   . Cancer Paternal Aunt     breast  . Heart disease Paternal Uncle     hole in heart  . Hypertension Maternal Grandmother   . Cancer Maternal Grandfather     lung  . Hypertension Maternal Grandfather   . Heart disease Paternal Grandmother   . Thyroid disease Paternal Grandmother   . Hypertension Paternal Grandmother   . Diabetes Paternal Grandfather   . Stroke Paternal Grandfather   . Hypertension Paternal Grandfather     Social History Social History  Substance Use Topics  . Smoking status: Current Every Day Smoker -- 0.25 packs/day for 23 years    Types: Cigarettes  . Smokeless tobacco: Never Used  . Alcohol Use: 0.0 oz/week    0  Standard drinks or equivalent per week     Comment: Occasional    Review of Systems  Constitutional: Negative for fever. Eyes: Negative for visual changes. ENT: Negative for sore throat. Cardiovascular: Negative for chest pain. Respiratory: Negative for shortness of breath. Gastrointestinal: Negative for abdominal pain, vomiting and diarrhea. Genitourinary: Negative for dysuria. Musculoskeletal: Negative for back pain. Skin: Negative for rash. Neurological: Negative for headaches, focal weakness or numbness. ____________________________________________  PHYSICAL EXAM:  VITAL SIGNS: ED Triage Vitals  Enc Vitals Group     BP 10/17/14 1711 147/71 mmHg     Pulse Rate 10/17/14 1711 80     Resp 10/17/14 1711 20     Temp 10/17/14 1711 98 F (36.7 C)     Temp src --      SpO2 10/17/14 1711 98 %     Weight 10/17/14 1711 271 lb (122.925 kg)     Height --      Head Cir --      Peak Flow --      Pain Score --      Pain Loc --      Pain Edu? --      Excl. in [place]? --    Constitutional: Alert and oriented. Well appearing and in no distress. Eyes: Conjunctivae are normal. PERRL. Normal extraocular movements. ENT   Head: Normocephalic and atraumatic.   Nose: No congestion/rhinorrhea.   Mouth/Throat: Mucous membranes are moist.   Neck: Supple. No thyromegaly. Hematological/Lymphatic/Immunological: No cervical lymphadenopathy. Cardiovascular: Normal rate, regular rhythm.  Respiratory: Normal respiratory effort. No wheezes/rales/rhonchi. Gastrointestinal: Soft and nontender. No distention. Musculoskeletal: Nontender with normal range of motion in all extremities.  Neurologic:  Normal gait without ataxia. Normal speech and language. No gross focal neurologic deficits are appreciated. Skin:  Skin is warm, dry and intact. No rash noted. Single, healed, superficial linear laceration to the medial thumb identified as dog bite site by patient. No surrounding erythema, edema, or  drainage.  Psychiatric: Mood and affect are normal. Patient exhibits appropriate insight and judgment. ____________________________________________  PROCEDURES  Rabavert 1 ml IM Hyperab RIG 2475 units IM ____________________________________________  INITIAL IMPRESSION / ASSESSMENT AND PLAN / ED COURSE  Postexposure rabies vaccine and immunoglobulin administration. Rabies vaccine 1 of 4 provided today, for post exposure management of a previously unvaccinated, immunocompetent patient. Patient provided with information related to the rabies virus transmission and prevention.  Questions were again answered and patient was advised on the vaccine schedule. She will likely report to the medicine urgent care for the remainder of the vaccine series. ____________________________________________  FINAL CLINICAL IMPRESSION(S) / ED DIAGNOSES  Final diagnoses:  Rabies exposure  Need for rabies vaccination      Melvenia Needles, PA-C 10/17/14 1752  Daymon Larsen, MD 10/17/14 [phone]

## 2014-10-17 NOTE — Discharge Instructions (Signed)
Rabies  °Rabies is a viral infection that can be spread to people from infected animals. The infection affects the brain and central nervous system. Once the disease develops, it almost always causes death. Because of this, when a person is bitten by an animal that may have rabies, treatment to prevent rabies often needs to be started whether or not the animal is known to be infected. Prompt treatment with the rabies vaccine and rabies immune globulin is very effective at preventing the infection from developing in people who have been exposed to the rabies virus. °CAUSES  °Rabies is caused by a virus that lives inside some animals. When a person is bitten by an infected animal, the rabies virus is spread to the person through the infected spit (saliva) of the animal. This virus can be carried by animals such as dogs, cats, skunks, bats, woodchucks, raccoons, coyotes, and foxes. °SYMPTOMS  °By the time symptoms appear, rabies is usually fatal for the person. Common symptoms include: °· Headache. °· Fever. °· Fatigue and weakness. °· Agitation. °· Anxiety. °· Confusion. °· Unusual behavior, such as hyperactivity, fear of water (hydrophobia), or fear of air (aerophobia). °· Hallucinations. °· Insomnia. °· Weakness in the arms or legs. °· Difficulty swallowing. °Most people get sick in 1-3 months after being bitten. This often varies and may depend on the location of the bite. The infection will take less time to develop if the bite occurred closer to the head.  °DIAGNOSIS  °To determine if a person is infected, several tests must be performed, such as: °· A skin biopsy. °· A saliva test. °· A lumbar puncture to remove spinal fluid so it can be examined. °· Blood tests. °TREATMENT  °Treatment to prevent the infection from developing (post-exposure prophylaxis, PEP) is often started before knowing for sure if the person has been exposed to the rabies virus. PEP involves cleaning the wound, giving an antibody injection  (rabies immune globulin), and giving a series of rabies vaccine injections. The series of injections are usually given over a two-week period. If possible, the animal that bit the person will be observed to see if it remains healthy. If the animal has been killed, it can be sent to a state laboratory and examined to see if the animal had rabies. °If a person is bitten by a domestic animal (dog, cat, or ferret) that appears healthy and can be observed to see if it remains healthy, often no further treatment is necessary other than care of the wounds caused by the animal. °Rabies is often a fatal illness once the infection develops in a person. Although a few people who developed rabies have survived after experimental treatment with certain drugs, all these survivors still had severe nervous system problems after the treatment. This is why caregivers use extra caution and begin PEP treatment for people who have been bitten by animals that are possibly infected with rabies.  °HOME CARE INSTRUCTIONS  °If you were bitten by an unknown animal, make sure you know your caregiver's instructions for follow-up. If the animal was sent to a laboratory for examination, ask when the test results will be ready. Make sure you get the test results.  °Take these steps to care for your wound: °· Keep the wound clean, dry, and dressed as directed by your caregiver. °· Keep the injured part elevated as much as possible. °· Do not resume use of the affected area until directed. °· Only take over-the-counter or prescription medicines as directed by   your caregiver. °· Keep all follow-up appointments as directed by your caregiver. °PREVENTION  °To prevent rabies, people need to reduce their risk of having contact with infected animals.  °· Make sure your pets (dogs, cats, ferrets) are vaccinated against rabies. Keep these vaccinations up-to-date as directed by your veterinarian. °· Supervise your pets when they are outside. Keep them away  from wild animals. °· Call your local animal control services to report any stray animals. These animals may not be vaccinated. °· Stay away from stray or wild animals. °· Consider getting the rabies vaccine (preexposure) if you are traveling to an area where rabies is common or if your job or activities involve possible contact with wild or stray animals. Discuss this with your caregiver. °Document Released: 01/07/2005 Document Revised: 10/02/2011 Document Reviewed: 08/06/2011 °ExitCare® Patient Information ©2015 [employer]. This information is not intended to replace advice given to you by your health care provider. Make sure you discuss any questions you have with your health care provider. ° °Rabies Vaccine °What You Need to Know °WHAT IS RABIES? °· Rabies is a serious disease. It is caused by a virus. °· Rabies is mainly a disease of animals. Humans get rabies when they are bitten by infected animals. °· At first there might not be any symptoms. But weeks, or even years after a bite, rabies can cause pain, fatigue, headaches, fever, and irritability. These are followed by seizures, hallucinations, and paralysis. Human rabies is almost always fatal. °· Wild animals, especially bats, are the most common source of human rabies infection in the [place]. Skunks, raccoons, dogs, cats, coyotes, foxes, and other mammals can also transmit the disease. °· Human rabies is rare in the [place]. There have been only 55 cases diagnosed since 1990. However, between 16,000 and 39,000 people are vaccinated each year as a precaution after animal bites. Also, rabies is far more common in other parts of the world, with about 40,000 to 70,000 rabies-related deaths worldwide each year. Bites from unvaccinated dogs cause most of these cases. °Rabies vaccine can prevent rabies. °RABIES VACCINE °· Rabies vaccine is given to people at high risk of rabies to protect them if they are exposed. It can also prevent the disease  if it is given to a person after they have been exposed. °· Rabies vaccine is made from killed rabies virus. It cannot cause rabies. °WHO SHOULD GET RABIES VACCINE AND WHEN? °Preventive Vaccination (No Exposure) °· People at high risk of exposure to rabies, such as veterinarians, animal handlers, rabies laboratory workers, spelunkers, and rabies biologics production workers should be offered rabies vaccine. °· The vaccine should also be considered for: °¨ People whose activities bring them into frequent contact with rabies virus or with possibly rabid animals. °¨ International travelers who are likely to come in contact with animals in parts of the world where rabies is common. °· The pre-exposure schedule for rabies vaccination is 3 doses, given at the following times: °¨ Dose 1: As appropriate. °¨ Dose 2: 7 days after Dose 1. °¨ Dose 3: 21 days or 28 days after Dose 1. °· For laboratory workers and others who may be repeatedly exposed to rabies virus, periodic testing for immunity is recommended and booster doses should be given as needed. (Testing or booster doses are not recommended for travelers). Ask your doctor for details. °Vaccination After an Exposure °Anyone who has been bitten by an animal, or who otherwise may have been exposed to rabies, should clean the wound   and see a doctor immediately. The doctor will determine if they need to be vaccinated. A person who is exposed and has never been vaccinated against rabies should get 4 doses of rabies vaccine: one dose right away and additional doses on the 3rd, 7th, and 14th days. They should also get another shot called Rabies Immune Globulin at the same time as the first dose.  A person who has been previously vaccinated should get 2 doses of rabies vaccine: one right away and another on the 3rd day. Rabies Immune Globulin is not needed. TELL YOUR DOCTOR IF: Talk with a doctor before getting rabies vaccine if you:  Ever had a serious (life-threatening)  allergic reaction to a previous dose of rabies vaccine or to any component of the vaccine; tell your doctor if you have any severe allergies.  Have a weakened immune system because of:  HIV, AIDS, or another disease that affects the immune system.  Treatment with drugs that affect the immune system, such as steroids.  Cancer or cancer treatment with radiation or drugs. If you have a minor illness, such as a cold, you can be vaccinated. If you are moderately or severely ill, you should probably wait until you recover before getting a routine (non-exposure) dose of rabies vaccine. If you have been exposed to rabies virus, you should get the vaccine regardless of any other illnesses you may have. WHAT ARE THE RISKS FROM RABIES VACCINE? A vaccine, like any medicine, is capable of causing serious problems, such as severe allergic reactions. The risk of a vaccine causing serious harm, or death, is extremely small. Serious problems from rabies vaccine are very rare.  Mild problems:  Soreness, redness, swelling, or itching where the shot was given (30% to 74%).  Headache, nausea, abdominal pain, muscle aches, or dizziness (5% to 40%). Moderate problems:  Hives, pain in the joints, or fever (about 6% of booster doses).  Other nervous system disorders, such as Guillain-Barr Syndrome (GBS), have been reported after rabies vaccine, but this happens so rarely that it is not known whether they are related to the vaccine. Note: Several brands of rabies vaccine are available in the [place], and reactions may vary between brands. Your provider can give you more information about a particular brand. WHAT IF THERE IS A SERIOUS REACTION? What should I look for? Look for anything that concerns you, such as signs of a severe allergic reaction, very high fever, or behavior changes.  Signs of a severe allergic reaction can include hives, swelling of the face and throat, difficulty breathing, a fast  heartbeat, dizziness, and weakness. These would start a few minutes to a few hours after the vaccination. What should I do?  If you think it is a severe allergic reaction or other emergency that cannot wait, call 911 or get the person to the nearest hospital. Otherwise, call your doctor.  Afterward, the reaction should be reported to the Vaccine Adverse Event Reporting System (VAERS). Your doctor might file this report, or you can do it yourself through the VAERS website at www.vaers.SamedayNews.es or by calling [phone]. VAERS is only for reporting reactions. They do not give medical advice. HOW CAN I LEARN MORE?  Ask your doctor.  Call your local or state health department.  Contact the Centers for Disease Control and Prevention (CDC):  Visit the CDC rabies website at [place].no CDC Rabies Vaccine VIS (10/27/07) Document Released: 11/04/2005 Document Revised: 12/25/2011 Document Reviewed: 04/29/2012 [facility] Patient Information 2015 [place]. This information  is not intended to replace advice given to you by your health care provider. Make sure you discuss any questions you have with your health care provider.   For Post-Exposure Management:  Rabies Immune Globulin - 10/17/14  Dose #1 - 10/17/14 Dose #2 - 10/20/14 Dose #3 - 10/24/14 Dose #4 - 10/31/14

## 2014-10-17 NOTE — ED Notes (Signed)
States she was see  [name] Golden and returned to start rabies series

## 2014-10-17 NOTE — Telephone Encounter (Signed)
Patient called in regards to a dog bite.

## 2014-10-19 ENCOUNTER — Ambulatory Visit: Payer: Medicaid Other | Admitting: General Surgery

## 2014-10-21 ENCOUNTER — Emergency Department
Admission: EM | Admit: 2014-10-21 | Discharge: 2014-10-21 | Disposition: A | Discharge status: Home / Self Care | Payer: Medicaid Other | Attending: Emergency Medicine | Admitting: Emergency Medicine

## 2014-10-21 ENCOUNTER — Ambulatory Visit (INDEPENDENT_AMBULATORY_CARE_PROVIDER_SITE_OTHER): Payer: Medicaid Other | Admitting: General Surgery

## 2014-10-21 ENCOUNTER — Encounter: Payer: Self-pay | Admitting: Emergency Medicine

## 2014-10-21 ENCOUNTER — Encounter: Payer: Self-pay | Admitting: General Surgery

## 2014-10-21 VITALS — BP 137/86 | HR 78 | Temp 99.3°F | Ht 68.0 in | Wt 271.0 lb

## 2014-10-21 DIAGNOSIS — R109 Unspecified abdominal pain: Secondary | ICD-10-CM | POA: Diagnosis not present

## 2014-10-21 DIAGNOSIS — Z79899 Other long term (current) drug therapy: Secondary | ICD-10-CM | POA: Diagnosis not present

## 2014-10-21 DIAGNOSIS — E119 Type 2 diabetes mellitus without complications: Secondary | ICD-10-CM | POA: Insufficient documentation

## 2014-10-21 DIAGNOSIS — Z791 Long term (current) use of non-steroidal anti-inflammatories (NSAID): Secondary | ICD-10-CM | POA: Diagnosis not present

## 2014-10-21 DIAGNOSIS — I10 Essential (primary) hypertension: Secondary | ICD-10-CM | POA: Diagnosis not present

## 2014-10-21 DIAGNOSIS — F319 Bipolar disorder, unspecified: Secondary | ICD-10-CM | POA: Diagnosis not present

## 2014-10-21 DIAGNOSIS — Z23 Encounter for immunization: Secondary | ICD-10-CM | POA: Diagnosis present

## 2014-10-21 DIAGNOSIS — G8929 Other chronic pain: Secondary | ICD-10-CM | POA: Diagnosis not present

## 2014-10-21 DIAGNOSIS — Z720 Tobacco use: Secondary | ICD-10-CM | POA: Diagnosis not present

## 2014-10-21 DIAGNOSIS — Z880 Allergy status to penicillin: Secondary | ICD-10-CM | POA: Diagnosis not present

## 2014-10-21 MED ORDER — RABIES VACCINE, PCEC IM SUSR
1.0000 mL | Freq: Once | INTRAMUSCULAR | Status: AC
  Administered 2014-10-21: 1 mL via INTRAMUSCULAR
  Filled 2014-10-21: qty 1

## 2014-10-21 NOTE — ED Provider Notes (Signed)
[facility] Emergency Department Provider Note  ____________________________________________  Time seen: Approximately 1:08 PM  I have reviewed the triage vital signs and the nursing notes.   HISTORY  Chief Complaint Rabies Injection    HPI [name] Golden is a 39 y.o. female who presents for her second in the series of rabies injections. Denies any complaints or side effect. States that she's doing well.   Past Medical History  Diagnosis Date  . Anxiety   . Depression   . Osteoporosis   . Vitamin D deficiency   . Bipolar 1 disorder   . Diabetes   . Heavy periods   . Hypertension   . History of DVT (deep vein thrombosis)   . History of pulmonary embolus (PE)   . Endometriosis   . Painful intercourse   . Painful menstrual periods   . High cholesterol   . GERD (gastroesophageal reflux disease)   . History of ovarian cyst   . Increased BMI (body mass index)   . Menopausal symptom     Patient Active Problem List   Diagnosis Date Noted  . Ventral hernia 09/14/2014  . Chronic abdominal pain 09/14/2014  . Posttraumatic stress disorder 09/14/2014  . Diabetes mellitus type 2, uncontrolled 09/14/2014  . Hyperlipidemia 09/14/2014  . Morbid obesity 09/14/2014  . Essential hypertension, benign 09/14/2014  . Tobacco abuse 09/14/2014  . Deliberate self-cutting 09/14/2014  . Acne inversa 08/26/2012    Past Surgical History  Procedure Laterality Date  . Breast surgery    . Appendectomy    . Abdominal hysterectomy    . Laparoscopic adhesions removal for endometriosis      Current Outpatient Rx  Name  Route  Sig  Dispense  Refill  . amLODipine (NORVASC) 2.5 MG tablet   Oral   Take 1 tablet (2.5 mg total) by mouth daily.   30 tablet   3     Pharmacist, we are decreasing this dose   . atorvastatin (LIPITOR) 10 MG tablet      TAKE 1 TABLET BY MOUTH AT BEDTIME FOR CHOLESTEROL   30 tablet   2   . gabapentin (NEURONTIN) 100 MG  capsule   Oral   Take 1 capsule (100 mg total) by mouth 3 (three) times daily.   90 capsule   3   . meloxicam (MOBIC) 15 MG tablet   Oral   Take 1 tablet (15 mg total) by mouth daily.   30 tablet   3   . QUEtiapine (SEROQUEL XR) 200 MG 24 hr tablet   Oral   Take 1 tablet (200 mg total) by mouth at bedtime.   30 tablet   1   . sulfamethoxazole-trimethoprim (BACTRIM DS,SEPTRA DS) 800-160 MG per tablet   Oral   Take 1 tablet by mouth 2 (two) times daily.   19 tablet   0     Allergies Allegra; Codeine; Hydrocodone; Clindamycin/lincomycin; and Penicillins  Family History  Problem Relation Age of Onset  . Diabetes Mother   . Hypertension Mother   . Cancer Mother     cervical  . Hypertension Father   . Cancer Maternal Aunt     pancreatic  . Diabetes Paternal Aunt   . Cancer Paternal Aunt     breast  . Heart disease Paternal Uncle     hole in heart  . Hypertension Maternal Grandmother   . Cancer Maternal Grandfather     lung  . Hypertension Maternal Grandfather   . Heart  disease Paternal Grandmother   . Thyroid disease Paternal Grandmother   . Hypertension Paternal Grandmother   . Diabetes Paternal Grandfather   . Stroke Paternal Grandfather   . Hypertension Paternal Grandfather     Social History Social History  Substance Use Topics  . Smoking status: Current Every Day Smoker -- 0.25 packs/day for 23 years    Types: Cigarettes  . Smokeless tobacco: Never Used  . Alcohol Use: 0.0 oz/week    0 Standard drinks or equivalent per week     Comment: Occasional    Review of Systems Constitutional: No fever/chills Eyes: No visual changes. ENT: No sore throat. Cardiovascular: Denies chest pain. Respiratory: Denies shortness of breath. Gastrointestinal: No abdominal pain.  No nausea, no vomiting.  No diarrhea.  No constipation. Genitourinary: Negative for dysuria. Musculoskeletal: Negative for back pain. Skin: Negative for rash. Neurological: Negative for  headaches, focal weakness or numbness.  10-point ROS otherwise negative.  ____________________________________________   PHYSICAL EXAM:  VITAL SIGNS: ED Triage Vitals  Enc Vitals Group     BP 10/21/14 1234 139/90 mmHg     Pulse Rate 10/21/14 1234 78     Resp 10/21/14 1234 20     Temp 10/21/14 1234 98 F (36.7 C)     Temp Source 10/21/14 1234 Oral     SpO2 10/21/14 1234 98 %     Weight 10/21/14 1234 271 lb (122.925 kg)     Height 10/21/14 1234 5\' 8"  (1.727 m)     Head Cir --      Peak Flow --      Pain Score --      Pain Loc --      Pain Edu? --      Excl. in [place]? --     Constitutional: Alert and oriented. Well appearing and in no acute distress. Head: Atraumatic. Nose: No congestion/rhinnorhea. Mouth/Throat: Mucous membranes are moist.  Oropharynx non-erythematous. Neck: No stridor.   Cardiovascular: Normal rate, regular rhythm. Grossly normal heart sounds.  Good peripheral circulation. Respiratory: Normal respiratory effort.  No retractions. Lungs CTAB. Musculoskeletal: No lower extremity tenderness nor edema.  No joint effusions. Neurologic:  Normal speech and language. No gross focal neurologic deficits are appreciated. No gait instability. Skin:  Skin is warm, dry and intact. No rash noted. Psychiatric: Mood and affect are normal. Speech and behavior are normal.  ____________________________________________   LABS (all labs ordered are listed, but only abnormal results are displayed)  Labs Reviewed - No data to display ____________________________________________  PROCEDURES  Procedure(s) performed: None  Critical Care performed: No  ____________________________________________   INITIAL IMPRESSION / ASSESSMENT AND PLAN / ED COURSE  Pertinent labs & imaging results that were available during my care of the patient were reviewed by me and considered in my medical decision making (see chart for details).  Rabies vaccination #2. Patient to follow-up on  day #7 as scheduled. No other emergency medical complaints given at this time. ____________________________________________   FINAL CLINICAL IMPRESSION(S) / ED DIAGNOSES  Final diagnoses:  Need for prophylactic vaccination against rabies      Arlyss Repress, PA-C 10/21/14 [place], MD 10/21/14 2154

## 2014-10-21 NOTE — Discharge Instructions (Signed)
Rabies  Rabies is a viral infection that can be spread to people from infected animals. The infection affects the brain and central nervous system. Once the disease develops, it almost always causes death. Because of this, when a person is bitten by an animal that may have rabies, treatment to prevent rabies often needs to be started whether or not the animal is known to be infected. Prompt treatment with the rabies vaccine and rabies immune globulin is very effective at preventing the infection from developing in people who have been exposed to the rabies virus. CAUSES  Rabies is caused by a virus that lives inside some animals. When a person is bitten by an infected animal, the rabies virus is spread to the person through the infected spit (saliva) of the animal. This virus can be carried by animals such as dogs, cats, skunks, bats, woodchucks, raccoons, coyotes, and foxes. SYMPTOMS  By the time symptoms appear, rabies is usually fatal for the person. Common symptoms include:  Headache.  Fever.  Fatigue and weakness.  Agitation.  Anxiety.  Confusion.  Unusual behavior, such as hyperactivity, fear of water (hydrophobia), or fear of air (aerophobia).  Hallucinations.  Insomnia.  Weakness in the arms or legs.  Difficulty swallowing. Most people get sick in 1-3 months after being bitten. This often varies and may depend on the location of the bite. The infection will take less time to develop if the bite occurred closer to the head.  DIAGNOSIS  To determine if a person is infected, several tests must be performed, such as:  A skin biopsy.  A saliva test.  A lumbar puncture to remove spinal fluid so it can be examined.  Blood tests. TREATMENT  Treatment to prevent the infection from developing (post-exposure prophylaxis, PEP) is often started before knowing for sure if the person has been exposed to the rabies virus. PEP involves cleaning the wound, giving an antibody injection  (rabies immune globulin), and giving a series of rabies vaccine injections. The series of injections are usually given over a two-week period. If possible, the animal that bit the person will be observed to see if it remains healthy. If the animal has been killed, it can be sent to a state laboratory and examined to see if the animal had rabies. If a person is bitten by a domestic animal (dog, cat, or ferret) that appears healthy and can be observed to see if it remains healthy, often no further treatment is necessary other than care of the wounds caused by the animal. Rabies is often a fatal illness once the infection develops in a person. Although a few people who developed rabies have survived after experimental treatment with certain drugs, all these survivors still had severe nervous system problems after the treatment. This is why caregivers use extra caution and begin PEP treatment for people who have been bitten by animals that are possibly infected with rabies.  HOME CARE INSTRUCTIONS  If you were bitten by an unknown animal, make sure you know your caregiver's instructions for follow-up. If the animal was sent to a laboratory for examination, ask when the test results will be ready. Make sure you get the test results.  Take these steps to care for your wound:  Keep the wound clean, dry, and dressed as directed by your caregiver.  Keep the injured part elevated as much as possible.  Do not resume use of the affected area until directed.  Only take over-the-counter or prescription medicines as directed by   your caregiver.  Keep all follow-up appointments as directed by your caregiver. PREVENTION  To prevent rabies, people need to reduce their risk of having contact with infected animals.   Make sure your pets (dogs, cats, ferrets) are vaccinated against rabies. Keep these vaccinations up-to-date as directed by your veterinarian.  Supervise your pets when they are outside. Keep them away  from wild animals.  Call your local animal control services to report any stray animals. These animals may not be vaccinated.  Stay away from stray or wild animals.  Consider getting the rabies vaccine (preexposure) if you are traveling to an area where rabies is common or if your job or activities involve possible contact with wild or stray animals. Discuss this with your caregiver. Document Released: 01/07/2005 Document Revised: 10/02/2011 Document Reviewed: 08/06/2011 [employer] Patient Information 2015 [employer]. This information is not intended to replace advice given to you by your health care provider. Make sure you discuss any questions you have with your health care provider.  

## 2014-10-21 NOTE — ED Notes (Signed)
Here for additional rabies vaccine

## 2014-10-21 NOTE — Patient Instructions (Signed)
We will schedule an appointment with Dr. Allen Norris for you to be seen soon.  Continue your appointment with Dr. Enzo Bi.

## 2014-10-21 NOTE — Progress Notes (Signed)
Outpatient Surgical Follow Up  10/21/2014  [name] Golden is an 39 y.o. female.   Chief Complaint  Patient presents with  . Follow-up    Go over CT Scan results    HPI: 39 year old female returns to clinic for follow up of a recent CT scan that was performed for evaluation of upper abdominal pain. She had been worked up before and was thought to have a ventral hernia at the area of her pain. She states that the pain is actually much improved since her last visit and now only occurs after drinking cold liquids. No association with activity the pain she's currently complaining of. She does continue to state that she has stopped working out because of the abdominal pain. She also complains of subjective nausea and alternating constipation and diarrhea. She denies any fevers, chills, chest pain, shortness of breath. She also states she's re: has a scheduled follow-up with OB/GYN for evaluation of her pelvic cysts.  Past Medical History  Diagnosis Date  . Anxiety   . Depression   . Osteoporosis   . Vitamin D deficiency   . Bipolar 1 disorder   . Diabetes   . Heavy periods   . Hypertension   . History of DVT (deep vein thrombosis)   . History of pulmonary embolus (PE)   . Endometriosis   . Painful intercourse   . Painful menstrual periods   . High cholesterol   . GERD (gastroesophageal reflux disease)   . History of ovarian cyst   . Increased BMI (body mass index)   . Menopausal symptom     Past Surgical History  Procedure Laterality Date  . Breast surgery    . Appendectomy    . Abdominal hysterectomy    . Laparoscopic adhesions removal for endometriosis      Family History  Problem Relation Age of Onset  . Diabetes Mother   . Hypertension Mother   . Cancer Mother     cervical  . Hypertension Father   . Cancer Maternal Aunt     pancreatic  . Diabetes Paternal Aunt   . Cancer Paternal Aunt     breast  . Heart disease Paternal Uncle     hole in heart  .  Hypertension Maternal Grandmother   . Cancer Maternal Grandfather     lung  . Hypertension Maternal Grandfather   . Heart disease Paternal Grandmother   . Thyroid disease Paternal Grandmother   . Hypertension Paternal Grandmother   . Diabetes Paternal Grandfather   . Stroke Paternal Grandfather   . Hypertension Paternal Grandfather     Social History:  reports that she has been smoking Cigarettes.  She has a 5.75 pack-year smoking history. She has never used smokeless tobacco. She reports that she drinks alcohol. She reports that she does not use illicit drugs.  Allergies:  Allergies  Allergen Reactions  . Allegra [Fexofenadine] Swelling  . Codeine Swelling  . Hydrocodone Swelling  . Clindamycin/Lincomycin   . Penicillins Itching    Medications reviewed.    ROS A multipoint review of systems was completed. All pertinent positives and negatives within the history of present illness remainder negative.   BP 137/86 mmHg  Pulse 78  Temp(Src) 99.3 F (37.4 C) (Oral)  Ht 5\' 8"  (1.727 m)  Wt 122.925 kg (271 lb)  BMI 41.22 kg/m2  LMP 05/19/2013  Physical Exam  Gen.: No acute distress  Chest: Clear to auscultation regular rhythm Abdomen: Large, soft, nondistended. Minimally tender  to palpation in the upper epigastric region without any evidence of rebound or peritonitis.  CT scan reviewed no evidence of hernia at the area of complaint. No results found for this or any previous visit (from the past 48 hour(s)). No results found.  Assessment/Plan:  1. Chronic abdominal pain 39 year old female with chronic abdominal pain. No evidence of hernia on CT scan. Discussed with patient that her abdominal pain with its relation to food warrants a gastroenterological workup. She has previously been seen by Dr. Allen Norris and is interested in returning to his care for this continued workup. Discussed that her alternating constipation and diarrhea could also be investigated same time with  Dr. Allen Norris. Patient voiced understanding that if no other cause could be identified that her abdominal pain might be weight-related. She voiced an interest in weight loss surgery and would be willing to have further evaluation after completing treatment with GYN and GI. No general surgical needs at this time.     Clayburn Pert  10/21/2014,negative

## 2014-10-25 ENCOUNTER — Telehealth: Payer: Self-pay | Admitting: Gastroenterology

## 2014-10-25 NOTE — Telephone Encounter (Signed)
Patient would like to be called to discussed her appointment due to her symptoms getting worse.

## 2014-10-26 ENCOUNTER — Ambulatory Visit: Payer: Medicaid Other | Admitting: Obstetrics and Gynecology

## 2014-10-28 NOTE — Telephone Encounter (Signed)
LVM for pt to return my call to reschedule appt for 11-15-14.

## 2014-11-01 NOTE — Telephone Encounter (Signed)
Pt scheduled for earlier appt with Dr. Allen Norris on Oct 25th @ 1:45pm in [place].

## 2014-11-03 ENCOUNTER — Ambulatory Visit: Payer: Medicaid Other | Admitting: Obstetrics and Gynecology

## 2014-11-15 ENCOUNTER — Ambulatory Visit (INDEPENDENT_AMBULATORY_CARE_PROVIDER_SITE_OTHER): Payer: Medicaid Other | Admitting: Gastroenterology

## 2014-11-15 ENCOUNTER — Other Ambulatory Visit: Payer: Self-pay

## 2014-11-15 ENCOUNTER — Encounter: Payer: Self-pay | Admitting: Gastroenterology

## 2014-11-15 VITALS — BP 122/77 | HR 91 | Temp 98.6°F | Ht 67.0 in | Wt 276.2 lb

## 2014-11-15 DIAGNOSIS — R197 Diarrhea, unspecified: Secondary | ICD-10-CM | POA: Diagnosis not present

## 2014-11-15 DIAGNOSIS — K5900 Constipation, unspecified: Secondary | ICD-10-CM

## 2014-11-15 DIAGNOSIS — K5909 Other constipation: Secondary | ICD-10-CM

## 2014-11-15 NOTE — Progress Notes (Addendum)
Primary Care Physician: Enid Derry, MD  Primary Gastroenterologist:  Dr. Lucilla Lame  Chief Complaint  Patient presents with  . Abdominal Pain    Bloating  . Change in bowel habits    Constipation/diarrhea    HPI: [name] Golden is a 39 y.o. female here For follow-up of her chronic abdominal pain. The patient also reports that she has intestinal cramping with alternating diarrhea and constipation. The patient was seen by surgery who evaluated her for her ventral hernia. The patient continues to have abdominal pain and states it is worse when she is working out.  She was pursuing weight loss surgery but states that the doctor she was going to is not take her insurance.The patient also reports that she is not able to work because a previous job she held after to pick up something that way 50 pounds and she cannot do it without having abdominal pain. This resulted in her getting fired as reported by the patient  Current Outpatient Prescriptions  Medication Sig Dispense Refill  . amLODipine (NORVASC) 2.5 MG tablet Take 1 tablet (2.5 mg total) by mouth daily. 30 tablet 3  . atorvastatin (LIPITOR) 10 MG tablet TAKE 1 TABLET BY MOUTH AT BEDTIME FOR CHOLESTEROL 30 tablet 2  . Docusate Sodium (COLACE PO) Take by mouth.    . gabapentin (NEURONTIN) 100 MG capsule Take 1 capsule (100 mg total) by mouth 3 (three) times daily. 90 capsule 3  . meloxicam (MOBIC) 15 MG tablet Take 1 tablet (15 mg total) by mouth daily. 30 tablet 3  . QUEtiapine (SEROQUEL XR) 200 MG 24 hr tablet Take 1 tablet (200 mg total) by mouth at bedtime. 30 tablet 1  . sulfamethoxazole-trimethoprim (BACTRIM DS,SEPTRA DS) 800-160 MG per tablet Take 1 tablet by mouth 2 (two) times daily. (Patient not taking: Reported on 11/15/2014) 19 tablet 0   No current facility-administered medications for this visit.    Allergies as of 11/15/2014 - Review Complete 11/15/2014  Allergen Reaction Noted  . Allegra [fexofenadine]  Swelling 11/10/2013  . Codeine Swelling 07/18/2014  . Hydrocodone Swelling 07/18/2014  . Clindamycin/lincomycin  11/10/2013  . Penicillins Itching 07/18/2014    ROS:  General: Negative for anorexia, weight loss, fever, chills, fatigue, weakness. ENT: Negative for hoarseness, difficulty swallowing , nasal congestion. CV: Negative for chest pain, angina, palpitations, dyspnea on exertion, peripheral edema.  Respiratory: Negative for dyspnea at rest, dyspnea on exertion, cough, sputum, wheezing.  GI: See history of present illness. GU:  Negative for dysuria, hematuria, urinary incontinence, urinary frequency, nocturnal urination.  Endo: Negative for unusual weight change.    Physical Examination:   BP 122/77 mmHg  Pulse 91  Temp(Src) 98.6 F (37 C) (Oral)  Ht 5\' 7"  (1.702 m)  Wt 276 lb 3.2 oz (125.283 kg)  BMI 43.25 kg/m2  LMP 05/19/2013  General: Well-nourished, well-developed in no acute distress, obese Eyes: No icterus. Conjunctivae pink. Mouth: Oropharyngeal mucosa moist and pink , no lesions erythema or exudate. Lungs: Clear to auscultation bilaterally. Non-labored. Heart: Regular rate and rhythm, no murmurs rubs or gallops.  Abdomen: Bowel sounds are normal, Tended to palpation, nondistended, no hepatosplenomegaly or masses, no abdominal bruits or hernia , no rebound or guarding.   Extremities: No lower extremity edema. No clubbing or deformities. Neuro: Alert and oriented x 3.  Grossly intact. Skin: Warm and dry, no jaundice.   Psych: Alert and cooperative, normal mood and affect.  Labs:    Imaging Studies: No results found.  Assessment  and Plan:   [name] Golden is a 39 y.o. y/o female  Who comes in today with a history of a ventral hernia with abdominal pain that she states would be solved by her undergoing gastric bypass surgery. The patient also suffers from chronic back pain. The patient's physical exam is consistent with musculoskeletal pain with a  positive Carnett sign. The patient does report that she was supposed to have a colonoscopy in the past but never went through with it due to her change in bowel habits she will be set up for a colonoscopy. Her symptoms are consistent with irritable bowel syndrome with abdominal cramps and alternating diarrhea and constipation. I have discussed risks & benefits which include, but are not limited to, bleeding, infection, perforation & drug reaction.  The patient agrees with this plan & written consent will be obtained.      Note: This dictation was prepared with Dragon dictation along with smaller phrase technology. Any transcriptional errors that result from this process are unintentional.

## 2014-11-16 ENCOUNTER — Encounter: Payer: Self-pay | Admitting: *Deleted

## 2014-11-17 ENCOUNTER — Ambulatory Visit
Admission: RE | Admit: 2014-11-17 | Discharge: 2014-11-17 | Disposition: A | Discharge status: Home / Self Care | Payer: Medicaid Other | Source: Ambulatory Visit | Attending: Gastroenterology | Admitting: Gastroenterology

## 2014-11-17 ENCOUNTER — Encounter
Admission: RE | Disposition: A | Discharge status: Home / Self Care | Payer: Self-pay | Source: Ambulatory Visit | Attending: Gastroenterology

## 2014-11-17 ENCOUNTER — Ambulatory Visit: Payer: Medicaid Other | Admitting: Anesthesiology

## 2014-11-17 ENCOUNTER — Encounter: Payer: Self-pay | Admitting: *Deleted

## 2014-11-17 ENCOUNTER — Other Ambulatory Visit: Payer: Self-pay | Admitting: Gastroenterology

## 2014-11-17 DIAGNOSIS — E559 Vitamin D deficiency, unspecified: Secondary | ICD-10-CM | POA: Insufficient documentation

## 2014-11-17 DIAGNOSIS — R197 Diarrhea, unspecified: Secondary | ICD-10-CM | POA: Diagnosis present

## 2014-11-17 DIAGNOSIS — M810 Age-related osteoporosis without current pathological fracture: Secondary | ICD-10-CM | POA: Insufficient documentation

## 2014-11-17 DIAGNOSIS — K529 Noninfective gastroenteritis and colitis, unspecified: Secondary | ICD-10-CM | POA: Insufficient documentation

## 2014-11-17 DIAGNOSIS — K219 Gastro-esophageal reflux disease without esophagitis: Secondary | ICD-10-CM | POA: Insufficient documentation

## 2014-11-17 DIAGNOSIS — Z880 Allergy status to penicillin: Secondary | ICD-10-CM | POA: Diagnosis not present

## 2014-11-17 DIAGNOSIS — F419 Anxiety disorder, unspecified: Secondary | ICD-10-CM | POA: Diagnosis not present

## 2014-11-17 DIAGNOSIS — F319 Bipolar disorder, unspecified: Secondary | ICD-10-CM | POA: Diagnosis not present

## 2014-11-17 DIAGNOSIS — I10 Essential (primary) hypertension: Secondary | ICD-10-CM | POA: Diagnosis not present

## 2014-11-17 DIAGNOSIS — Z888 Allergy status to other drugs, medicaments and biological substances status: Secondary | ICD-10-CM | POA: Insufficient documentation

## 2014-11-17 DIAGNOSIS — M1389 Other specified arthritis, multiple sites: Secondary | ICD-10-CM | POA: Insufficient documentation

## 2014-11-17 DIAGNOSIS — Z885 Allergy status to narcotic agent status: Secondary | ICD-10-CM | POA: Diagnosis not present

## 2014-11-17 DIAGNOSIS — Z86711 Personal history of pulmonary embolism: Secondary | ICD-10-CM | POA: Diagnosis not present

## 2014-11-17 DIAGNOSIS — R194 Change in bowel habit: Secondary | ICD-10-CM | POA: Insufficient documentation

## 2014-11-17 DIAGNOSIS — E119 Type 2 diabetes mellitus without complications: Secondary | ICD-10-CM | POA: Diagnosis not present

## 2014-11-17 DIAGNOSIS — G4730 Sleep apnea, unspecified: Secondary | ICD-10-CM | POA: Diagnosis not present

## 2014-11-17 DIAGNOSIS — Z881 Allergy status to other antibiotic agents status: Secondary | ICD-10-CM | POA: Insufficient documentation

## 2014-11-17 DIAGNOSIS — E7800 Pure hypercholesterolemia, unspecified: Secondary | ICD-10-CM | POA: Diagnosis not present

## 2014-11-17 DIAGNOSIS — Z86718 Personal history of other venous thrombosis and embolism: Secondary | ICD-10-CM | POA: Insufficient documentation

## 2014-11-17 DIAGNOSIS — F17210 Nicotine dependence, cigarettes, uncomplicated: Secondary | ICD-10-CM | POA: Diagnosis not present

## 2014-11-17 HISTORY — DX: Headache: R51

## 2014-11-17 HISTORY — DX: Cardiac murmur, unspecified: R01.1

## 2014-11-17 HISTORY — DX: Sleep apnea, unspecified: G47.30

## 2014-11-17 HISTORY — PX: COLONOSCOPY WITH PROPOFOL: SHX5780

## 2014-11-17 HISTORY — DX: Unspecified osteoarthritis, unspecified site: M19.90

## 2014-11-17 HISTORY — DX: Headache, unspecified: R51.9

## 2014-11-17 SURGERY — COLONOSCOPY WITH PROPOFOL
Anesthesia: Monitor Anesthesia Care | Wound class: Contaminated

## 2014-11-17 MED ORDER — STERILE WATER FOR IRRIGATION IR SOLN
Status: DC | PRN
  Administered 2014-11-17: 09:00:00

## 2014-11-17 MED ORDER — ACETAMINOPHEN 160 MG/5ML PO SOLN: 325.0000 mg | ORAL | Status: DC | PRN

## 2014-11-17 MED ORDER — LACTATED RINGERS IV SOLN
INTRAVENOUS | Status: DC
  Administered 2014-11-17: 08:00:00 via INTRAVENOUS

## 2014-11-17 MED ORDER — PROPOFOL 10 MG/ML IV BOLUS
INTRAVENOUS | Status: DC | PRN
  Administered 2014-11-17 (×4): 50 mg via INTRAVENOUS
  Administered 2014-11-17: 100 mg via INTRAVENOUS
  Administered 2014-11-17 (×2): 50 mg via INTRAVENOUS

## 2014-11-17 MED ORDER — ACETAMINOPHEN 325 MG PO TABS: 325.0000 mg | ORAL_TABLET | ORAL | Status: DC | PRN

## 2014-11-17 MED ORDER — LIDOCAINE HCL (CARDIAC) 20 MG/ML IV SOLN
INTRAVENOUS | Status: DC | PRN
  Administered 2014-11-17: 50 mg via INTRAVENOUS

## 2014-11-17 SURGICAL SUPPLY — 28 items
CANISTER SUCT 1200ML W/VALVE (MISCELLANEOUS) ×3 IMPLANT
FCP ESCP3.2XJMB 240X2.8X (MISCELLANEOUS)
FORCEPS BIOP RAD 4 LRG CAP 4 (CUTTING FORCEPS) ×3 IMPLANT
FORCEPS BIOP RJ4 240 W/NDL (MISCELLANEOUS)
FORCEPS ESCP3.2XJMB 240X2.8X (MISCELLANEOUS) IMPLANT
GOWN CVR UNV OPN BCK APRN NK (MISCELLANEOUS) ×2 IMPLANT
GOWN ISOL THUMB LOOP REG UNIV (MISCELLANEOUS) ×4
HEMOCLIP INSTINCT (CLIP) IMPLANT
INJECTOR VARIJECT VIN23 (MISCELLANEOUS) IMPLANT
KIT CO2 TUBING (TUBING) IMPLANT
KIT DEFENDO VALVE AND CONN (KITS) IMPLANT
KIT ENDO PROCEDURE OLY (KITS) ×3 IMPLANT
LIGATOR MULTIBAND 6SHOOTER MBL (MISCELLANEOUS) IMPLANT
MARKER SPOT ENDO TATTOO 5ML (MISCELLANEOUS) IMPLANT
PAD GROUND ADULT SPLIT (MISCELLANEOUS) IMPLANT
SNARE SHORT THROW 13M SML OVAL (MISCELLANEOUS) IMPLANT
SNARE SHORT THROW 30M LRG OVAL (MISCELLANEOUS) IMPLANT
SPOT EX ENDOSCOPIC TATTOO (MISCELLANEOUS)
SUCTION POLY TRAP 4CHAMBER (MISCELLANEOUS) IMPLANT
TRAP SUCTION POLY (MISCELLANEOUS) IMPLANT
TUBING CONN 6MMX3.1M (TUBING)
TUBING SUCTION CONN 0.25 STRL (TUBING) IMPLANT
UNDERPAD 30X60 958B10 (PK) (MISCELLANEOUS) IMPLANT
VALVE BIOPSY ENDO (VALVE) IMPLANT
VARIJECT INJECTOR VIN23 (MISCELLANEOUS)
WATER AUXILLARY (MISCELLANEOUS) IMPLANT
WATER STERILE IRR 250ML POUR (IV SOLUTION) ×3 IMPLANT
WATER STERILE IRR 500ML POUR (IV SOLUTION) IMPLANT

## 2014-11-17 NOTE — Op Note (Signed)
[facility] Gastroenterology Patient Name: [name] Golden Procedure Date: 11/17/2014 8:25 AM MRN: [id number] Account #: [id number] Date of Birth: [birth year] Admit Type: Outpatient Age: 39 Room: [facility] OR ROOM 01 Gender: Female Note Status: Finalized Procedure:         Colonoscopy Indications:       Chronic diarrhea Providers:         Lucilla Lame, MD Referring MD:      Arnetha Courser (Referring MD) Medicines:         Propofol per Anesthesia Complications:     No immediate complications. Procedure:         Pre-Anesthesia Assessment:                    - Prior to the procedure, a History and Physical was                     performed, and patient medications and allergies were                     reviewed. The patient's tolerance of previous anesthesia                     was also reviewed. The risks and benefits of the procedure                     and the sedation options and risks were discussed with the                     patient. All questions were answered, and informed consent                     was obtained. Prior Anticoagulants: The patient has taken                     no previous anticoagulant or antiplatelet agents. ASA                     Grade Assessment: II - A patient with mild systemic                     disease. After reviewing the risks and benefits, the                     patient was deemed in satisfactory condition to undergo                     the procedure.                    After obtaining informed consent, the colonoscope was                     passed under direct vision. Throughout the procedure, the                     patient's blood pressure, pulse, and oxygen saturations                     were monitored continuously. The Olympus CF-HQ190L                     Colonoscope (S#. [id number]) was introduced through the anus  and advanced to the the terminal ileum. The colonoscopy                     was performed without  difficulty. The patient tolerated                     the procedure well. The quality of the bowel preparation                     was excellent. Findings:      The perianal and digital rectal examinations were normal.      The terminal ileum appeared normal. Biopsies were taken with a cold       forceps for histology.      The colon (entire examined portion) appeared normal.      Random biopsies were obtained with cold forceps for histology randomly       in the entire colon. Impression:        - The examined portion of the ileum was normal. Biopsied.                    - The entire examined colon is normal.                    - Random biopsies were obtained in the entire colon. Recommendation:    - Await pathology results. Procedure Code(s): --- Professional ---                    [phone], Colonoscopy, flexible; with biopsy, single or                     multiple Diagnosis Code(s): --- Professional ---                    K52.9, Noninfective gastroenteritis and colitis,                     unspecified CPT copyright 2014 American Medical Association. All rights reserved. The codes documented in this report are preliminary and upon coder review may  be revised to meet current compliance requirements. Lucilla Lame, MD 11/17/2014 8:50:14 AM This report has been signed electronically. Number of Addenda: 0 Note Initiated On: 11/17/2014 8:25 AM Scope Withdrawal Time: 0 hours 9 minutes 23 seconds  Total Procedure Duration: 0 hours 12 minutes 22 seconds       [facility]

## 2014-11-17 NOTE — Anesthesia Procedure Notes (Signed)
Procedure Name: MAC Performed by: Karyme Mcconathy Pre-anesthesia Checklist: Patient identified, Emergency Drugs available, Suction available, Timeout performed and Patient being monitored Patient Re-evaluated:Patient Re-evaluated prior to inductionOxygen Delivery Method: Nasal cannula Placement Confirmation: positive ETCO2     

## 2014-11-17 NOTE — Anesthesia Preprocedure Evaluation (Signed)
Anesthesia Evaluation  Patient identified by MRN, date of birth, ID band  Reviewed: Allergy & Precautions, H&P , NPO status , Patient's Chart, lab work & pertinent test results  Airway Mallampati: I  TM Distance: >3 FB Neck ROM: full    Dental no notable dental hx.    Pulmonary sleep apnea , Current Smoker,    Pulmonary exam normal        Cardiovascular hypertension,  Rhythm:regular Rate:Normal     Neuro/Psych PSYCHIATRIC DISORDERS    GI/Hepatic GERD  ,  Endo/Other  diabetesMorbid obesity  Renal/GU      Musculoskeletal   Abdominal   Peds  Hematology   Anesthesia Other Findings Off of anti-hyperglycemics  Reproductive/Obstetrics                             Anesthesia Physical Anesthesia Plan  ASA: II  Anesthesia Plan: MAC   Post-op Pain Management:    Induction:   Airway Management Planned:   Additional Equipment:   Intra-op Plan:   Post-operative Plan:   Informed Consent: I have reviewed the patients History and Physical, chart, labs and discussed the procedure including the risks, benefits and alternatives for the proposed anesthesia with the patient or authorized representative who has indicated his/her understanding and acceptance.     Plan Discussed with: CRNA  Anesthesia Plan Comments:         Anesthesia Quick Evaluation

## 2014-11-17 NOTE — Transfer of Care (Signed)
Immediate Anesthesia Transfer of Care Note  Patient: [name] Golden  Procedure(s) Performed: Procedure(s) with comments: COLONOSCOPY WITH PROPOFOL (N/A) - CPAP Diabetic - diet controlled  Patient Location: PACU  Anesthesia Type: MAC  Level of Consciousness: awake, alert  and patient cooperative  Airway and Oxygen Therapy: Patient Spontanous Breathing and Patient connected to supplemental oxygen  Post-op Assessment: Post-op Vital signs reviewed, Patient's Cardiovascular Status Stable, Respiratory Function Stable, Patent Airway and No signs of Nausea or vomiting  Post-op Vital Signs: Reviewed and stable  Complications: No apparent anesthesia complications

## 2014-11-17 NOTE — Anesthesia Postprocedure Evaluation (Signed)
  Anesthesia Post-op Note  Patient: [name] Golden Demetrice Branch  Procedure(s) Performed: Procedure(s) with comments: COLONOSCOPY WITH PROPOFOL (N/A) - CPAP Diabetic - diet controlled  Anesthesia type:MAC  Patient location: PACU  Post pain: Pain level controlled  Post assessment: Post-op Vital signs reviewed, Patient's Cardiovascular Status Stable, Respiratory Function Stable, Patent Airway and No signs of Nausea or vomiting  Post vital signs: Reviewed and stable  Last Vitals:  Filed Vitals:   11/17/14 0900  BP: 144/93  Pulse: 93  Temp:   Resp: 17    Level of consciousness: awake, alert  and patient cooperative  Complications: No apparent anesthesia complications

## 2014-11-17 NOTE — H&P (Signed)
[facility]  [address], [place], [place] [address] Phone: [phone] Fax : [phone]  Primary Care Physician:  Enid Derry, MD Primary Gastroenterologist:  Dr. Allen Norris  Pre-Procedure History & Physical: HPI:  [name] Golden is a 39 y.o. female is here for an colonoscopy.   Past Medical History  Diagnosis Date  . Anxiety   . Depression   . Osteoporosis   . Vitamin D deficiency   . Bipolar 1 disorder ([place])   . Heavy periods   . Hypertension   . History of DVT (deep vein thrombosis) 2004    leg, neck, arm  . History of pulmonary embolus (PE) 2004  . Endometriosis   . Painful intercourse   . Painful menstrual periods   . High cholesterol   . GERD (gastroesophageal reflux disease)   . History of ovarian cyst   . Increased BMI (body mass index)   . Menopausal symptom   . Heart murmur     as child  . Sleep apnea     CPAP  . Diabetes ([place])     diet controlled  . Headache     sporadic - stress/allergies  . Arthritis     right foot and knee    Past Surgical History  Procedure Laterality Date  . Breast surgery    . Appendectomy    . Abdominal hysterectomy    . Laparoscopic adhesions removal for endometriosis      Prior to Admission medications   Medication Sig Start Date End Date Taking? Authorizing Provider  amLODipine (NORVASC) 2.5 MG tablet Take 1 tablet (2.5 mg total) by mouth daily. 09/14/14   Arnetha Courser, MD  atorvastatin (LIPITOR) 10 MG tablet TAKE 1 TABLET BY MOUTH AT BEDTIME FOR CHOLESTEROL 09/27/14   Arnetha Courser, MD  Docusate Sodium (COLACE PO) Take by mouth.    Historical Provider, MD  gabapentin (NEURONTIN) 100 MG capsule Take 1 capsule (100 mg total) by mouth 3 (three) times daily. 07/18/14   Max T Hyatt, DPM  meloxicam (MOBIC) 15 MG tablet Take 1 tablet (15 mg total) by mouth daily. 07/18/14   Max T Hyatt, DPM  QUEtiapine (SEROQUEL XR) 200 MG 24 hr tablet Take 1 tablet (200 mg total) by mouth at bedtime. 10/14/14    Arnetha Courser, MD  sulfamethoxazole-trimethoprim (BACTRIM DS,SEPTRA DS) 800-160 MG per tablet Take 1 tablet by mouth 2 (two) times daily. Patient not taking: Reported on 11/15/2014 10/16/14   Dannielle Karvonen Menshew, PA-C    Allergies as of 11/15/2014 - Review Complete 11/15/2014  Allergen Reaction Noted  . Allegra [fexofenadine] Swelling 11/10/2013  . Codeine Swelling 07/18/2014  . Hydrocodone Swelling 07/18/2014  . Clindamycin/lincomycin  11/10/2013  . Penicillins Itching 07/18/2014    Family History  Problem Relation Age of Onset  . Diabetes Mother   . Hypertension Mother   . Cancer Mother     cervical  . Hypertension Father   . Cancer Maternal Aunt     pancreatic  . Diabetes Paternal Aunt   . Cancer Paternal Aunt     breast  . Heart disease Paternal Uncle     hole in heart  . Hypertension Maternal Grandmother   . Cancer Maternal Grandfather     lung  . Hypertension Maternal Grandfather   . Heart disease Paternal Grandmother   . Thyroid disease Paternal Grandmother   . Hypertension Paternal Grandmother   . Diabetes Paternal Grandfather   . Stroke Paternal Grandfather   .  Hypertension Paternal Grandfather     Social History   Social History  . Marital Status: Legally Separated    Spouse Name: N/A  . Number of Children: N/A  . Years of Education: N/A   Occupational History  . Not on file.   Social History Main Topics  . Smoking status: Current Every Day Smoker -- 0.50 packs/day for 23 years    Types: Cigarettes  . Smokeless tobacco: Never Used     Comment: off and on smoker since age 23  . Alcohol Use: 0.0 oz/week    0 Standard drinks or equivalent per week     Comment: Occasional  . Drug Use: No  . Sexual Activity: Yes   Other Topics Concern  . Not on file   Social History Narrative    Review of Systems: See HPI, otherwise negative ROS  Physical Exam: Ht 5\' 7"  (1.702 m)  Wt 276 lb (125.193 kg)  BMI 43.22 kg/m2  LMP 05/19/2013 General:    Alert,  pleasant and cooperative in NAD Head:  Normocephalic and atraumatic. Neck:  Supple; no masses or thyromegaly. Lungs:  Clear throughout to auscultation.    Heart:  Regular rate and rhythm. Abdomen:  Soft, nontender and nondistended. Normal bowel sounds, without guarding, and without rebound.   Neurologic:  Alert and  oriented x4;  grossly normal neurologically.  Impression/Plan: [name] Golden is here for an colonoscopy to be performed for change in bowel habits.  Risks, benefits, limitations, and alternatives regarding  colonoscopy have been reviewed with the patient.  Questions have been answered.  All parties agreeable.   Ollen Bowl, MD  11/17/2014, 7:38 AM

## 2014-11-18 ENCOUNTER — Encounter: Payer: Self-pay | Admitting: Gastroenterology

## 2014-11-21 ENCOUNTER — Encounter: Payer: Self-pay | Admitting: Gastroenterology

## 2014-11-23 ENCOUNTER — Ambulatory Visit (INDEPENDENT_AMBULATORY_CARE_PROVIDER_SITE_OTHER): Payer: Medicaid Other | Admitting: Family Medicine

## 2014-11-23 ENCOUNTER — Encounter: Payer: Self-pay | Admitting: Family Medicine

## 2014-11-23 VITALS — BP 148/91 | HR 98 | Temp 98.6°F | Ht 66.5 in | Wt 277.0 lb

## 2014-11-23 DIAGNOSIS — R718 Other abnormality of red blood cells: Secondary | ICD-10-CM | POA: Diagnosis not present

## 2014-11-23 DIAGNOSIS — G8929 Other chronic pain: Secondary | ICD-10-CM

## 2014-11-23 DIAGNOSIS — E1165 Type 2 diabetes mellitus with hyperglycemia: Secondary | ICD-10-CM | POA: Diagnosis not present

## 2014-11-23 DIAGNOSIS — Z720 Tobacco use: Secondary | ICD-10-CM | POA: Diagnosis not present

## 2014-11-23 DIAGNOSIS — I10 Essential (primary) hypertension: Secondary | ICD-10-CM | POA: Diagnosis not present

## 2014-11-23 DIAGNOSIS — E785 Hyperlipidemia, unspecified: Secondary | ICD-10-CM | POA: Diagnosis not present

## 2014-11-23 DIAGNOSIS — R7989 Other specified abnormal findings of blood chemistry: Secondary | ICD-10-CM

## 2014-11-23 DIAGNOSIS — R109 Unspecified abdominal pain: Secondary | ICD-10-CM

## 2014-11-23 DIAGNOSIS — K5909 Other constipation: Secondary | ICD-10-CM | POA: Diagnosis not present

## 2014-11-23 DIAGNOSIS — E559 Vitamin D deficiency, unspecified: Secondary | ICD-10-CM | POA: Diagnosis not present

## 2014-11-23 MED ORDER — HYDROCHLOROTHIAZIDE 25 MG PO TABS: 25.0000 mg | ORAL_TABLET | Freq: Every day | ORAL | Status: DC

## 2014-11-23 MED ORDER — POLYETHYLENE GLYCOL 3350 17 GM/SCOOP PO POWD: 17.0000 g | Freq: Every day | ORAL | Status: DC

## 2014-11-23 NOTE — Patient Instructions (Addendum)
Try calling 1-800-QUIT-NOW to see if you can get help with nicotine replacement products  Ask your therapist and psychiatrist if now is a good time to try to quit with the help of Chantix, and if so, please call me back for the prescription  Return in 2 weeks for blood pressure check and fasting labs  I do recommend yearly flu shots; for individuals who don't want flu shots, try to practice excellent hand hygiene, and avoid nursing homes, day cares, and hospitals during peak flu season; taking vitamin C daily during flu/cold season may help boost your immune system too  STOP amlodipine and START HCTZ for blood pressure   Smoking Cessation, Tips for Success If you are ready to quit smoking, congratulations! You have chosen to help yourself be healthier. Cigarettes bring nicotine, tar, carbon monoxide, and other irritants into your body. Your lungs, heart, and blood vessels will be able to work better without these poisons. There are many different ways to quit smoking. Nicotine gum, nicotine patches, a nicotine inhaler, or nicotine nasal spray can help with physical craving. Hypnosis, support groups, and medicines help break the habit of smoking. WHAT THINGS CAN I DO TO MAKE QUITTING EASIER?  Here are some tips to help you quit for good:  Pick a date when you will quit smoking completely. Tell all of your friends and family about your plan to quit on that date.  Do not try to slowly cut down on the number of cigarettes you are smoking. Pick a quit date and quit smoking completely starting on that day.  Throw away all cigarettes.   Clean and remove all ashtrays from your home, work, and car.  On a card, write down your reasons for quitting. Carry the card with you and read it when you get the urge to smoke.  Cleanse your body of nicotine. Drink enough water and fluids to keep your urine clear or pale yellow. Do this after quitting to flush the nicotine from your body.  Learn to predict  your moods. Do not let a bad situation be your excuse to have a cigarette. Some situations in your life might tempt you into wanting a cigarette.  Never have "just one" cigarette. It leads to wanting another and another. Remind yourself of your decision to quit.  Change habits associated with smoking. If you smoked while driving or when feeling stressed, try other activities to replace smoking. Stand up when drinking your coffee. Brush your teeth after eating. Sit in a different chair when you read the paper. Avoid alcohol while trying to quit, and try to drink fewer caffeinated beverages. Alcohol and caffeine may urge you to smoke.  Avoid foods and drinks that can trigger a desire to smoke, such as sugary or spicy foods and alcohol.  Ask people who smoke not to smoke around you.  Have something planned to do right after eating or having a cup of coffee. For example, plan to take a walk or exercise.  Try a relaxation exercise to calm you down and decrease your stress. Remember, you may be tense and nervous for the first 2 weeks after you quit, but this will pass.  Find new activities to keep your hands busy. Play with a pen, coin, or rubber band. Doodle or draw things on paper.  Brush your teeth right after eating. This will help cut down on the craving for the taste of tobacco after meals. You can also try mouthwash.   Use oral substitutes in  place of cigarettes. Try using lemon drops, carrots, cinnamon sticks, or chewing gum. Keep them handy so they are available when you have the urge to smoke.  When you have the urge to smoke, try deep breathing.  Designate your home as a nonsmoking area.  If you are a heavy smoker, ask your health care provider about a prescription for nicotine chewing gum. It can ease your withdrawal from nicotine.  Reward yourself. Set aside the cigarette money you save and buy yourself something nice.  Look for support from others. Join a support group or smoking  cessation program. Ask someone at home or at work to help you with your plan to quit smoking.  Always ask yourself, "Do I need this cigarette or is this just a reflex?" Tell yourself, "Today, I choose not to smoke," or "I do not want to smoke." You are reminding yourself of your decision to quit.  Do not replace cigarette smoking with electronic cigarettes (commonly called e-cigarettes). The safety of e-cigarettes is unknown, and some may contain harmful chemicals.  If you relapse, do not give up! Plan ahead and think about what you will do the next time you get the urge to smoke. HOW WILL I FEEL WHEN I QUIT SMOKING? You may have symptoms of withdrawal because your body is used to nicotine (the addictive substance in cigarettes). You may crave cigarettes, be irritable, feel very hungry, cough often, get headaches, or have difficulty concentrating. The withdrawal symptoms are only temporary. They are strongest when you first quit but will go away within 10-14 days. When withdrawal symptoms occur, stay in control. Think about your reasons for quitting. Remind yourself that these are signs that your body is healing and getting used to being without cigarettes. Remember that withdrawal symptoms are easier to treat than the major diseases that smoking can cause.  Even after the withdrawal is over, expect periodic urges to smoke. However, these cravings are generally short lived and will go away whether you smoke or not. Do not smoke! WHAT RESOURCES ARE AVAILABLE TO HELP ME QUIT SMOKING? Your health care provider can direct you to community resources or hospitals for support, which may include:  Group support.  Education.  Hypnosis.  Therapy.   This information is not intended to replace advice given to you by your health care provider. Make sure you discuss any questions you have with your health care provider.   Document Released: 10/06/2003 Document Revised: 01/28/2014 Document Reviewed:  06/25/2012 Elsevier Interactive Patient Education 2016 [place] DASH stands for "Dietary Approaches to Stop Hypertension." The DASH eating plan is a healthy eating plan that has been shown to reduce high blood pressure (hypertension). Additional health benefits may include reducing the risk of type 2 diabetes mellitus, heart disease, and stroke. The DASH eating plan may also help with weight loss. WHAT DO I NEED TO KNOW ABOUT THE DASH EATING PLAN? For the DASH eating plan, you will follow these general guidelines:  Choose foods with a percent daily value for sodium of less than 5% (as listed on the food label).  Use salt-free seasonings or herbs instead of table salt or sea salt.  Check with your health care provider or pharmacist before using salt substitutes.  Eat lower-sodium products, often labeled as "lower sodium" or "no salt added."  Eat fresh foods.  Eat more vegetables, fruits, and low-fat dairy products.  Choose whole grains. Look for the word "whole" as the first word in the ingredient  list.  Choose fish and skinless chicken or [place] more often than red meat. Limit fish, poultry, and meat to 6 oz (170 g) each day.  Limit sweets, desserts, sugars, and sugary drinks.  Choose heart-healthy fats.  Limit cheese to 1 oz (28 g) per day.  Eat more home-cooked food and less restaurant, buffet, and fast food.  Limit fried foods.  Cook foods using methods other than frying.  Limit canned vegetables. If you do use them, rinse them well to decrease the sodium.  When eating at a restaurant, ask that your food be prepared with less salt, or no salt if possible. WHAT FOODS CAN I EAT? Seek help from a dietitian for individual calorie needs. Grains Whole grain or whole wheat bread. Brown rice. Whole grain or whole wheat pasta. Quinoa, bulgur, and whole grain cereals. Low-sodium cereals. Corn or whole wheat flour tortillas. Whole grain cornbread. Whole grain  crackers. Low-sodium crackers. Vegetables Fresh or frozen vegetables (raw, steamed, roasted, or grilled). Low-sodium or reduced-sodium tomato and vegetable juices. Low-sodium or reduced-sodium tomato sauce and paste. Low-sodium or reduced-sodium canned vegetables.  Fruits All fresh, canned (in natural juice), or frozen fruits. Meat and Other Protein Products Ground beef (85% or leaner), grass-fed beef, or beef trimmed of fat. Skinless chicken or [place]. Ground chicken or [place]. Pork trimmed of fat. All fish and seafood. Eggs. Dried beans, peas, or lentils. Unsalted nuts and seeds. Unsalted canned beans. Dairy Low-fat dairy products, such as skim or 1% milk, 2% or reduced-fat cheeses, low-fat ricotta or cottage cheese, or plain low-fat yogurt. Low-sodium or reduced-sodium cheeses. Fats and Oils Tub margarines without trans fats. Light or reduced-fat mayonnaise and salad dressings (reduced sodium). Avocado. Safflower, olive, or canola oils. Natural peanut or almond butter. Other Unsalted popcorn and pretzels. The items listed above may not be a complete list of recommended foods or beverages. Contact your dietitian for more options. WHAT FOODS ARE NOT RECOMMENDED? Grains White bread. White pasta. White rice. Refined cornbread. Bagels and croissants. Crackers that contain trans fat. Vegetables Creamed or fried vegetables. Vegetables in a cheese sauce. Regular canned vegetables. Regular canned tomato sauce and paste. Regular tomato and vegetable juices. Fruits Dried fruits. Canned fruit in light or heavy syrup. Fruit juice. Meat and Other Protein Products Fatty cuts of meat. Ribs, chicken wings, bacon, sausage, bologna, salami, chitterlings, fatback, hot dogs, bratwurst, and packaged luncheon meats. Salted nuts and seeds. Canned beans with salt. Dairy Whole or 2% milk, cream, half-and-half, and cream cheese. Whole-fat or sweetened yogurt. Full-fat cheeses or blue cheese. Nondairy creamers and  whipped toppings. Processed cheese, cheese spreads, or cheese curds. Condiments Onion and garlic salt, seasoned salt, table salt, and sea salt. Canned and packaged gravies. Worcestershire sauce. Tartar sauce. Barbecue sauce. Teriyaki sauce. Soy sauce, including reduced sodium. Steak sauce. Fish sauce. Oyster sauce. Cocktail sauce. Horseradish. Ketchup and mustard. Meat flavorings and tenderizers. Bouillon cubes. Hot sauce. Tabasco sauce. Marinades. Taco seasonings. Relishes. Fats and Oils Butter, stick margarine, lard, shortening, ghee, and bacon fat. Coconut, palm kernel, or palm oils. Regular salad dressings. Other Pickles and olives. Salted popcorn and pretzels. The items listed above may not be a complete list of foods and beverages to avoid. Contact your dietitian for more information. WHERE CAN I FIND MORE INFORMATION? National Heart, Lung, and Blood Institute: travelstabloid.com   This information is not intended to replace advice given to you by your health care provider. Make sure you discuss any questions you have with your health care provider.  Document Released: 12/27/2010 Document Revised: 01/28/2014 Document Reviewed: 11/11/2012 Elsevier Interactive Patient Education [employer].

## 2014-11-23 NOTE — Assessment & Plan Note (Addendum)
Quit-now number given, as she may qualify for free nicotine replacement; she would be interested in Chantix; she'll ask therapist on Friday what they think about chantix with her current mental status issues; explained risk of suicidal ideation, so I want them fully involved in this decision; I would love for her to quit smoking; greater than 3 minutes spent discussion tobacco cessation

## 2014-11-23 NOTE — Progress Notes (Signed)
BP 148/91 mmHg  Pulse 98  Temp(Src) 98.6 F (37 C)  Ht 5' 6.5" (1.689 m)  Wt 277 lb (125.646 kg)  BMI 44.04 kg/m2  SpO2 98%  LMP 05/19/2013   Subjective:    Patient ID: [name] Golden, female    DOB: July 29, 1975, 39 y.o.   MRN: [id number]  HPI: [name] Golden is a 39 y.o. female  Chief Complaint  Patient presents with  . Hypertension  . Hyperlipidemia    Patient is not fasting  . Diabetes   Recent dog bite right thumb, right-handed; had rabies shots, lacking two more boosters; thumb is doing better though; they told her to just go ahead and do it; just annoying  She is still smoking; she quit for four days when she left here last; her husband is back living with her and he is a heavy smoker; she is going to try to get coupons for [employer]; she is doing well with her depression; just aggressive; goes in for next session and sees therapist on Friday  Diabetes; she is not checking sugars; her meter died; she wants to see if she qualifies for a new one; had some nausea, felt a little shaky, mouth dry; not taking any medicine for sugar right now; last glucose she recalls is 157; has macaroni on Sundays  High cholesterol; she is not fasting; she'll return another day for fasting labs; not many eggs; does eat some bacon, sausage; usually cereal or oatmeal for breakfast; using quinoa and flax seed  She does not want a flu shot; does not do them  She has been tired; has had abdominal bloating; seeing GI for that; they have done scans; can't button pants some days CT abd and pelvis Sept 9th discussed; stools might have little bits come out, some times comes out soft, sometimes hard at first Relevant past medical, surgical, family and social history reviewed and updated as indicated. Interim medical history since our last visit reviewed. Allergies and medications reviewed and updated.  Review of Systems Per HPI unless specifically indicated above     Objective:    BP  148/91 mmHg  Pulse 98  Temp(Src) 98.6 F (37 C)  Ht 5' 6.5" (1.689 m)  Wt 277 lb (125.646 kg)  BMI 44.04 kg/m2  SpO2 98%  LMP 05/19/2013  Wt Readings from Last 3 Encounters:  11/23/14 277 lb (125.646 kg)  11/17/14 274 lb (124.286 kg)  11/15/14 276 lb 3.2 oz (125.283 kg)    Physical Exam  Constitutional: She appears well-developed and well-nourished. No distress.  Morbid obese, weight stable  HENT:  Head: Normocephalic and atraumatic.  Mouth/Throat: Mucous membranes are normal.  Eyes: EOM are normal. No scleral icterus.  Cardiovascular: Normal rate.   No murmur heard. Pulmonary/Chest: Effort normal. No respiratory distress. She has no wheezes.  Abdominal: Soft. Bowel sounds are normal. She exhibits no distension (full).  Musculoskeletal: Normal range of motion. She exhibits no edema.  Neurological: She is alert. She exhibits normal muscle tone.  Skin: Skin is warm and dry. No bruising and no rash noted. She is not diaphoretic. No pallor.  Psychiatric: She has a normal mood and affect. Her behavior is normal. Judgment and thought content normal.  Very pleasant, good eye contact with examiner    Diabetic Foot Form - Detailed   Diabetic Foot Exam - detailed  Diabetic Foot exam was performed with the following findings:  Yes 11/23/2014  2:23 PM  Visual Foot Exam completed.:  Yes  Are  the toenails long?:  No  Are the toenails thick?:  No  Are the toenails ingrown?:  No (Comment: status post partial nail removal left great toe, well-healed)    Pulse Foot Exam completed.:  Yes  Right Dorsalis Pedis:  Present Left Dorsalis Pedis:  Present  Sensory Foot Exam Completed.:  Yes  Swelling:  No  Semmes-Weinstein Monofilament Test  R Site 1-Great Toe:  Pos L Site 1-Great Toe:  Pos  R Site 4:  Pos L Site 4:  Pos  R Site 5:  Pos L Site 5:  Pos    Comments:  Slight callus lateral midfoot left foot, ball of foot right foot; no ulceration     Results for orders placed or performed in  visit on 06/17/14  HM MAMMOGRAPHY  Result Value Ref Range   HM Mammogram negative    HM PAP SMEAR  Result Value Ref Range   HM Pap smear normal per pt       Assessment & Plan:   Problem List Items Addressed This Visit      Cardiovascular and Mediastinum   Essential hypertension, benign    With her constipation, will stop the amlodipine; start HCTZ instead; return to see CMA in 2 weeks for recheck BP and weight and pulse, and also to get labs (check electrolytes at that visit with other labs); weight loss, DASH guidelines      Relevant Medications   hydrochlorothiazide (HYDRODIURIL) 25 MG tablet     Digestive   Constipation    Stop amlodipine; add miralax; water and fiber, activity        Endocrine   Diabetes mellitus type 2, uncontrolled ([place]) - Primary    Foot exam by MD today; eye exams yearly; check A1C and urine microalbumin in two weeks when she returns; BP not to goal; adding HCTZ; child-bearing age, so no ACE-I right now; last urine microalbumin (Dec 2015) was normal      Relevant Orders   Hgb A1c w/o eAG     Other   Chronic abdominal pain    Seeing GI; will check CMP along with other labs in 2 weeks      Relevant Orders   Comprehensive metabolic panel   Hyperlipidemia    Check fasting lipids; goal HDL over 50, goal TG under 150, goal LDL under 100      Relevant Medications   hydrochlorothiazide (HYDRODIURIL) 25 MG tablet   Other Relevant Orders   Lipid Panel w/o Chol/HDL Ratio   Morbid obesity (HCC)    I remain in support of her trying to undergo bariatric surgery as a means of significant weight loss; I do believe that significant weight loss would result in improvement in her A1C, lipid panel, blood pressure, and emotional outlook and self-esteem      Tobacco abuse    Quit-now number given, as she may qualify for free nicotine replacement; she would be interested in Chantix; she'll ask therapist on Friday what they think about chantix with her current  mental status issues; explained risk of suicidal ideation, so I want them fully involved in this decision; I would Golden for her to quit smoking; greater than 3 minutes spent discussion tobacco cessation      Vitamin D deficiency    Check level; deficiency may contribute to fatigue, depressed mood      Relevant Orders   Vit D  25 hydroxy (rtn osteoporosis monitoring)   Microcytosis    Review of old records show this  is persistent, but with elevated ferritin; I searched and did find that I checked the [facility] test Nov 2014 and it was negative .D: 11/25/12 : 2:09pm .T: [id number] .PV: ML Status: F Hereditary  Hemochromatosis     Comment NO MUTATION IDENTIFIED I also checked her hemoglobin electrophoresis in 2014 and it was normal .D: 10/01/12 : 10:28am .T: [id number] .PV: ML Status: F   Hgb F     0.0 %    Range: 0.0-2.0 [facility]  Hgb A     98.4 %    Range: 94.0-98.0    Flag: H [facility]  Hgb S     0.0 %    Range: 0.0 [facility] [place]  Hgb C     0.0 %    Range: 0.0 [facility]  Hgb A2     1.6 %    Range: 0.7-3.1 [facility] [place]  Hgb Variant     NP [facility] [place]  Interpretation     Comment Normal adult hemoglobin present. [place] RTP      Relevant Orders   CBC with Differential/Platelet   Elevated ferritin    I looked through [facility] records and [place] records; I do not see a hereditary hemochromatosis test done; I looked back through [facility] records and found that I did check for the [facility] mutation and it was negative in Dec 2014 .D: 11/25/12 : 2:09pm .T: [id number] .PV: ML Status: F Hereditary  Hemochromatosis     Comment NO MUTATION IDENTIFIED [facility] RTP         Follow up plan: Return in about 2 weeks (around 12/07/2014) for fasting labs and BP visit with Amy.  Meds ordered this encounter  Medications  . polyethylene glycol powder (GLYCOLAX/MIRALAX) powder    Sig: Take 17 g by mouth daily. Mixed in 4-8 ounces of  water, drink once a day    Dispense:  500 g    Refill:  1  . hydrochlorothiazide (HYDRODIURIL) 25 MG tablet    Sig: Take 1 tablet (25 mg total) by mouth daily. Stop amlodipine    Dispense:  30 tablet    Refill:  0   An after-visit summary was printed and given to the patient at [place].  Please see the patient instructions which may contain other information and recommendations beyond what is mentioned above in the assessment and plan.  Orders Placed This Encounter  Procedures  . Hgb A1c w/o eAG  . Lipid Panel w/o Chol/HDL Ratio  . Comprehensive metabolic panel  . Vit D  25 hydroxy (rtn osteoporosis monitoring)  . CBC with Differential/Platelet

## 2014-11-27 DIAGNOSIS — E559 Vitamin D deficiency, unspecified: Secondary | ICD-10-CM | POA: Insufficient documentation

## 2014-11-27 DIAGNOSIS — R7989 Other specified abnormal findings of blood chemistry: Secondary | ICD-10-CM | POA: Insufficient documentation

## 2014-11-27 DIAGNOSIS — K5900 Constipation, unspecified: Secondary | ICD-10-CM | POA: Insufficient documentation

## 2014-11-27 DIAGNOSIS — R718 Other abnormality of red blood cells: Secondary | ICD-10-CM | POA: Insufficient documentation

## 2014-11-27 NOTE — Assessment & Plan Note (Signed)
Check level; deficiency may contribute to fatigue, depressed mood

## 2014-11-27 NOTE — Assessment & Plan Note (Signed)
Seeing GI; will check CMP along with other labs in 2 weeks

## 2014-11-27 NOTE — Assessment & Plan Note (Signed)
Stop amlodipine; add miralax; water and fiber, activity

## 2014-11-27 NOTE — Assessment & Plan Note (Signed)
Check fasting lipids; goal HDL over 50, goal TG under 150, goal LDL under 100

## 2014-11-27 NOTE — Assessment & Plan Note (Signed)
With her constipation, will stop the amlodipine; start HCTZ instead; return to see CMA in 2 weeks for recheck BP and weight and pulse, and also to get labs (check electrolytes at that visit with other labs); weight loss, DASH guidelines

## 2014-11-27 NOTE — Assessment & Plan Note (Signed)
I remain in support of her trying to undergo bariatric surgery as a means of significant weight loss; I do believe that significant weight loss would result in improvement in her A1C, lipid panel, blood pressure, and emotional outlook and self-esteem

## 2014-11-27 NOTE — Assessment & Plan Note (Addendum)
I looked through [facility] records and [place] records; I do not see a hereditary hemochromatosis test done; I looked back through [facility] records and found that I did check for the [facility] mutation and it was negative in Dec 2014 .D: 11/25/12 : 2:09pm .T: [id number] .PV: ML Status: F Hereditary  Hemochromatosis     Comment NO MUTATION IDENTIFIED [facility] RTP

## 2014-11-27 NOTE — Assessment & Plan Note (Addendum)
Review of old records show this is persistent, but with elevated ferritin; I searched and did find that I checked the [facility] test Nov 2014 and it was negative .D: 11/25/12 : 2:09pm .T: [id number] .PV: ML Status: F Hereditary  Hemochromatosis     Comment NO MUTATION IDENTIFIED I also checked her hemoglobin electrophoresis in 2014 and it was normal .D: 10/01/12 : 10:28am .T: [id number] .PV: ML Status: F   Hgb F     0.0 %    Range: 0.0-2.0 [facility]  Hgb A     98.4 %    Range: 94.0-98.0    Flag: H [facility] [place]  Hgb S     0.0 %    Range: 0.0 [facility]  Hgb C     0.0 %    Range: 0.0 [facility] [place]  Hgb A2     1.6 %    Range: 0.7-3.1 [facility]  Hgb Variant     NP [facility] [place]  Interpretation     Comment Normal adult hemoglobin present. [place] RTP

## 2014-11-27 NOTE — Assessment & Plan Note (Addendum)
Foot exam by MD today; eye exams yearly; check A1C and urine microalbumin in two weeks when she returns; BP not to goal; adding HCTZ; child-bearing age, so no ACE-I right now; last urine microalbumin (Dec 2015) was normal

## 2014-12-01 ENCOUNTER — Ambulatory Visit: Payer: Medicaid Other | Admitting: Gastroenterology

## 2014-12-09 ENCOUNTER — Encounter: Payer: Self-pay | Admitting: Family Medicine

## 2014-12-09 ENCOUNTER — Ambulatory Visit (INDEPENDENT_AMBULATORY_CARE_PROVIDER_SITE_OTHER): Payer: Medicaid Other | Admitting: Family Medicine

## 2014-12-09 DIAGNOSIS — I10 Essential (primary) hypertension: Secondary | ICD-10-CM

## 2014-12-09 DIAGNOSIS — G8929 Other chronic pain: Secondary | ICD-10-CM | POA: Diagnosis not present

## 2014-12-09 DIAGNOSIS — E1165 Type 2 diabetes mellitus with hyperglycemia: Secondary | ICD-10-CM

## 2014-12-09 DIAGNOSIS — R109 Unspecified abdominal pain: Secondary | ICD-10-CM

## 2014-12-09 DIAGNOSIS — E559 Vitamin D deficiency, unspecified: Secondary | ICD-10-CM | POA: Diagnosis not present

## 2014-12-09 DIAGNOSIS — R718 Other abnormality of red blood cells: Secondary | ICD-10-CM | POA: Diagnosis not present

## 2014-12-09 DIAGNOSIS — E785 Hyperlipidemia, unspecified: Secondary | ICD-10-CM | POA: Diagnosis not present

## 2014-12-09 NOTE — Assessment & Plan Note (Addendum)
Patient continuing to struggle with weight loss; awaiting word from bariatric surgery; I fully support her decision to seek a surgical treatment for weight loss; her excess weight puts her at extra risk of medical complications in the future; we reviewed the idea of using an app or website like myfitnesspal to count calories; see AVS for recommendations; continue to reduce total calories, reduce empty calories, try to stay active, drink adequate water, return in one month

## 2014-12-09 NOTE — Progress Notes (Signed)
BP 134/86 mmHg  Pulse 93  Temp(Src) 99.1 F (37.3 C)  Ht 5' 6.5" (1.689 m)  Wt 274 lb (124.286 kg)  BMI 43.57 kg/m2  SpO2 97%  LMP 05/19/2013   Subjective:    Patient ID: [name] Golden, female    DOB: [birth year], 39 y.o.   MRN: [id number]  HPI: [name] is a 39 y.o. female  Chief Complaint  Patient presents with  . Hypertension   HTN; she rarely checks BP away from doctor; trying to watch her salt intake  She is waiting to hear back from [facility] about bariatric surgery Positives in regards to weight loss: She has a new job at [employer] (she'll have fresh fruits and veggies and other healthy foods available now regularly) She has been watching her dairy intake; hardly eats any fried foods, except for fried fish just every two weeks or so She is limiting her salt and sugar intake; she still uses pure butter, but the quantity has been cut down She drinks enough water she thinks  Struggles with weight loss: She does not know how to count calories when I asked if she knew about how many kcal she was taking in per day She is reaching for food now that she has quit smoking; used to smoking first thing in the morning and after dinner; has that hand to mouth She quit smoking on Saturday last week; chewing gum now to try to not eat instead  She has diabetes; she has chronic abd pain and follows with a GI doctor for that  Relevant past medical, surgical, family and social history reviewed and updated as indicated. Interim medical history since our last visit reviewed. Allergies and medications reviewed and updated.  Review of Systems Per HPI unless specifically indicated above     Objective:    BP 134/86 mmHg  Pulse 93  Temp(Src) 99.1 F (37.3 C)  Ht 5' 6.5" (1.689 m)  Wt 274 lb (124.286 kg)  BMI 43.57 kg/m2  SpO2 97%  LMP 05/19/2013  Wt Readings from Last 3 Encounters:  12/09/14 274 lb (124.286 kg)  11/23/14 277 lb (125.646 kg)  11/17/14  274 lb (124.286 kg)    Physical Exam  Constitutional: She appears well-developed and well-nourished.  Morbidly obese  HENT:  Mouth/Throat: Mucous membranes are normal.  Eyes: EOM are normal. No scleral icterus.  Cardiovascular: Normal rate and regular rhythm.   Pulmonary/Chest: Effort normal and breath sounds normal.  Skin: Skin is warm. No rash noted. No pallor.  Psychiatric: She has a normal mood and affect. Her behavior is normal.      Assessment & Plan:   Problem List Items Addressed This Visit      Cardiovascular and Mediastinum   Essential hypertension, benign    Not quite controlled today; goal systolic is less than [id number] mmHg since she has diabetes; significant weight loss would likely help lower her blood pressure to normotensive range; I fully support her decision to seek out definitive weight loss through bariatric surgery; continue to avoid excess sodium        Endocrine   Diabetes mellitus type 2, uncontrolled (HCC)    Check A1C today; significant weight loss (as could be achieved through bariatric surgery), would significant help lower her A1C and could help restore her blood sugars to normal most likely        Other   Chronic abdominal pain    Seeing GI; recent CT scan from Sept reviewed  Hyperlipidemia    Significant weight loss would likely help this as well; praised patient for trying to eat healthier; 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 her regular access to fresh fruits and vegetables and may improve her diet further      Morbid obesity ([place]) - Primary    Patient continuing to struggle with weight loss; awaiting word from bariatric surgery; I fully support her decision to seek a surgical treatment for weight loss; her excess weight puts her at extra risk of medical complications in the future; we reviewed the idea of using an app or website like myfitnesspal to count calories; see AVS for recommendations; continue to  reduce total calories, reduce empty calories, try to stay active, drink adequate water, return in one month      Vitamin D deficiency   Microcytosis    Normal adult hemoglobin (electrophoresis test) Dec 2014; recheck CBC today         Follow up plan: Return in about 1 month (around 01/08/2015) for follow-up.  An after-visit summary was printed and given to the patient at [facility].  Please see the patient instructions which may contain other information and recommendations beyond what is mentioned above in the assessment and plan.  Face-to-face time with patient was more than 25 minutes, >50% time spent counseling and coordination of care

## 2014-12-09 NOTE — Patient Instructions (Addendum)
Get an account with [email or web address] For the first 3-5 days, just get an idea of your intake average After that, work on decreasing your calories by 250-500 calories per day Reducing 500 calories a day will help you lose one pound of fat per week Bring some of your intake information with you to your next appointment Try to stay active, 7 minute scientific work-out at your own pace and modify exercises as needed Return in one month for diet and weight loss discussion

## 2014-12-10 LAB — CBC WITH DIFFERENTIAL/PLATELET
BASOS ABS: 0 10*3/uL (ref 0.0–0.2)
BASOS: 0 %
EOS (ABSOLUTE): 0.1 10*3/uL (ref 0.0–0.4)
EOS: 1 %
HEMOGLOBIN: 12.6 g/dL (ref 11.1–15.9)
Hematocrit: 37.9 % (ref 34.0–46.6)
IMMATURE GRANS (ABS): 0 10*3/uL (ref 0.0–0.1)
Immature Granulocytes: 0 %
LYMPHS: 39 %
Lymphocytes Absolute: 4.1 10*3/uL — ABNORMAL HIGH (ref 0.7–3.1)
MCH: 24.6 pg — AB (ref 26.6–33.0)
MCHC: 33.2 g/dL (ref 31.5–35.7)
MCV: 74 fL — ABNORMAL LOW (ref 79–97)
MONOCYTES: 7 %
Monocytes Absolute: 0.7 10*3/uL (ref 0.1–0.9)
NEUTROS PCT: 53 %
Neutrophils Absolute: 5.6 10*3/uL (ref 1.4–7.0)
Platelets: 314 10*3/uL (ref 150–379)
RBC: 5.12 x10E6/uL (ref 3.77–5.28)
RDW: 15.6 % — ABNORMAL HIGH (ref 12.3–15.4)
WBC: 10.6 10*3/uL (ref 3.4–10.8)

## 2014-12-10 LAB — COMPREHENSIVE METABOLIC PANEL
A/G RATIO: 1.8 (ref 1.1–2.5)
ALBUMIN: 4.4 g/dL (ref 3.5–5.5)
ALT: 10 IU/L (ref 0–32)
AST: 13 IU/L (ref 0–40)
Alkaline Phosphatase: 80 IU/L (ref 39–117)
BILIRUBIN TOTAL: 0.3 mg/dL (ref 0.0–1.2)
BUN/Creatinine Ratio: 11 (ref 8–20)
BUN: 7 mg/dL (ref 6–20)
CALCIUM: 9.6 mg/dL (ref 8.7–10.2)
CO2: 22 mmol/L (ref 18–29)
Chloride: 99 mmol/L (ref 97–106)
Creatinine, Ser: 0.66 mg/dL (ref 0.57–1.00)
GFR calc Af Amer: 129 mL/min/{1.73_m2} (ref >59)
GFR calc non Af Amer: 112 mL/min/{1.73_m2} (ref >59)
Globulin, Total: 2.5 g/dL (ref 1.5–4.5)
Glucose: 126 mg/dL — ABNORMAL HIGH (ref 65–99)
Potassium: 4.4 mmol/L (ref 3.5–5.2)
Sodium: 138 mmol/L (ref 136–144)
TOTAL PROTEIN: 6.9 g/dL (ref 6.0–8.5)

## 2014-12-10 LAB — LIPID PANEL W/O CHOL/HDL RATIO
CHOLESTEROL TOTAL: 150 mg/dL (ref 100–199)
HDL: 39 mg/dL — ABNORMAL LOW (ref >39)
LDL CALC: 93 mg/dL (ref 0–99)
TRIGLYCERIDES: 91 mg/dL (ref 0–149)
VLDL Cholesterol Cal: 18 mg/dL (ref 5–40)

## 2014-12-10 LAB — VITAMIN D 25 HYDROXY (VIT D DEFICIENCY, FRACTURES): Vit D, 25-Hydroxy: 18.1 ng/mL — ABNORMAL LOW (ref 30.0–100.0)

## 2014-12-12 LAB — HGB A1C W/O EAG: HEMOGLOBIN A1C: 8.3 % — AB (ref 4.8–5.6)

## 2014-12-14 NOTE — Assessment & Plan Note (Signed)
Seeing GI; recent CT scan from Sept reviewed

## 2014-12-14 NOTE — Assessment & Plan Note (Signed)
Normal adult hemoglobin (electrophoresis test) Dec 2014; recheck CBC today

## 2014-12-14 NOTE — Assessment & Plan Note (Signed)
Not quite controlled today; goal systolic is less than [id number] mmHg since she has diabetes; significant weight loss would likely help lower her blood pressure to normotensive range; I fully support her decision to seek out definitive weight loss through bariatric surgery; continue to avoid excess sodium

## 2014-12-14 NOTE — Assessment & Plan Note (Signed)
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

## 2014-12-14 NOTE — Assessment & Plan Note (Signed)
Check A1C today; significant weight loss (as could be achieved through bariatric surgery), would significant help lower her A1C and could help restore her blood sugars to normal most likely

## 2014-12-18 ENCOUNTER — Ambulatory Visit
Admission: EM | Admit: 2014-12-18 | Discharge: 2014-12-18 | Discharge status: Against Medical Advice | Payer: Medicaid Other

## 2014-12-20 ENCOUNTER — Telehealth: Payer: Self-pay | Admitting: Family Medicine

## 2014-12-20 ENCOUNTER — Encounter: Payer: Medicaid Other | Admitting: Podiatry

## 2014-12-20 ENCOUNTER — Encounter: Payer: Self-pay | Admitting: Emergency Medicine

## 2014-12-20 ENCOUNTER — Ambulatory Visit
Admission: EM | Admit: 2014-12-20 | Discharge: 2014-12-20 | Disposition: A | Discharge status: Home / Self Care | Payer: Medicaid Other | Attending: Family Medicine | Admitting: Family Medicine

## 2014-12-20 ENCOUNTER — Ambulatory Visit: Payer: Medicaid Other

## 2014-12-20 DIAGNOSIS — E559 Vitamin D deficiency, unspecified: Secondary | ICD-10-CM | POA: Insufficient documentation

## 2014-12-20 DIAGNOSIS — M25561 Pain in right knee: Secondary | ICD-10-CM

## 2014-12-20 DIAGNOSIS — F419 Anxiety disorder, unspecified: Secondary | ICD-10-CM | POA: Diagnosis not present

## 2014-12-20 DIAGNOSIS — F319 Bipolar disorder, unspecified: Secondary | ICD-10-CM | POA: Diagnosis not present

## 2014-12-20 DIAGNOSIS — Z79899 Other long term (current) drug therapy: Secondary | ICD-10-CM | POA: Insufficient documentation

## 2014-12-20 DIAGNOSIS — M179 Osteoarthritis of knee, unspecified: Secondary | ICD-10-CM | POA: Diagnosis not present

## 2014-12-20 DIAGNOSIS — M722 Plantar fascial fibromatosis: Secondary | ICD-10-CM | POA: Insufficient documentation

## 2014-12-20 DIAGNOSIS — K219 Gastro-esophageal reflux disease without esophagitis: Secondary | ICD-10-CM | POA: Diagnosis not present

## 2014-12-20 DIAGNOSIS — I10 Essential (primary) hypertension: Secondary | ICD-10-CM | POA: Diagnosis not present

## 2014-12-20 DIAGNOSIS — M1710 Unilateral primary osteoarthritis, unspecified knee: Secondary | ICD-10-CM

## 2014-12-20 DIAGNOSIS — F17210 Nicotine dependence, cigarettes, uncomplicated: Secondary | ICD-10-CM | POA: Insufficient documentation

## 2014-12-20 DIAGNOSIS — M1711 Unilateral primary osteoarthritis, right knee: Secondary | ICD-10-CM | POA: Diagnosis not present

## 2014-12-20 DIAGNOSIS — E119 Type 2 diabetes mellitus without complications: Secondary | ICD-10-CM | POA: Diagnosis not present

## 2014-12-20 DIAGNOSIS — E7800 Pure hypercholesterolemia, unspecified: Secondary | ICD-10-CM | POA: Diagnosis not present

## 2014-12-20 DIAGNOSIS — M79671 Pain in right foot: Secondary | ICD-10-CM | POA: Diagnosis present

## 2014-12-20 DIAGNOSIS — G4730 Sleep apnea, unspecified: Secondary | ICD-10-CM | POA: Insufficient documentation

## 2014-12-20 MED ORDER — KETOROLAC TROMETHAMINE 60 MG/2ML IM SOLN
60.0000 mg | Freq: Once | INTRAMUSCULAR | Status: AC
  Administered 2014-12-20: 60 mg via INTRAMUSCULAR

## 2014-12-20 MED ORDER — CELECOXIB 200 MG PO CAPS: 200.0000 mg | ORAL_CAPSULE | Freq: Two times a day (BID) | ORAL | Status: DC

## 2014-12-20 NOTE — Telephone Encounter (Signed)
Pt called would like referral for Ortho for knee pain. Pt stated she is in fear of losing her new job due to having to be out with knee pain. Please call pt and advise ASAP.      [phone] (cell)
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
## 2014-12-20 NOTE — ED Notes (Signed)
Patient c/o right foot pain and right knee pain and lower leg pain for a week.

## 2014-12-20 NOTE — Discharge Instructions (Signed)
Plantar Fasciitis With Rehab The plantar fascia is a fibrous, ligament-like, soft-tissue structure that spans the bottom of the foot. Plantar fasciitis, also called heel spur syndrome, is a condition that causes pain in the foot due to inflammation of the tissue. SYMPTOMS   Pain and tenderness on the underneath side of the foot.  Pain that worsens with standing or walking. CAUSES  Plantar fasciitis is caused by irritation and injury to the plantar fascia on the underneath side of the foot. Common mechanisms of injury include:  Direct trauma to bottom of the foot.  Damage to a small nerve that runs under the foot where the main fascia attaches to the heel bone.  Stress placed on the plantar fascia due to bone spurs. RISK INCREASES WITH:   Activities that place stress on the plantar fascia (running, jumping, pivoting, or cutting).  Poor strength and flexibility.  Improperly fitted shoes.  Tight calf muscles.  Flat feet.  Failure to warm-up properly before activity.  Obesity. PREVENTION  Warm up and stretch properly before activity.  Allow for adequate recovery between workouts.  Maintain physical fitness:  Strength, flexibility, and endurance.  Cardiovascular fitness.  Maintain a health body weight.  Avoid stress on the plantar fascia.  Wear properly fitted shoes, including arch supports for individuals who have flat feet. PROGNOSIS  If treated properly, then the symptoms of plantar fasciitis usually resolve without surgery. However, occasionally surgery is necessary. RELATED COMPLICATIONS   Recurrent symptoms that may result in a chronic condition.  Problems of the lower back that are caused by compensating for the injury, such as limping.  Pain or weakness of the foot during push-off following surgery.  Chronic inflammation, scarring, and partial or complete fascia tear, occurring more often from repeated injections. TREATMENT  Treatment initially involves  the use of ice and medication to help reduce pain and inflammation. The use of strengthening and stretching exercises may help reduce pain with activity, especially stretches of the Achilles tendon. These exercises may be performed at home or with a therapist. Your caregiver may recommend that you use heel cups of arch supports to help reduce stress on the plantar fascia. Occasionally, corticosteroid injections are given to reduce inflammation. If symptoms persist for greater than 6 months despite non-surgical (conservative), then surgery may be recommended.  MEDICATION   If pain medication is necessary, then nonsteroidal anti-inflammatory medications, such as aspirin and ibuprofen, or other minor pain relievers, such as acetaminophen, are often recommended.  Do not take pain medication within 7 days before surgery.  Prescription pain relievers may be given if deemed necessary by your caregiver. Use only as directed and only as much as you need.  Corticosteroid injections may be given by your caregiver. These injections should be reserved for the most serious cases, because they may only be given a certain number of times. HEAT AND COLD  Cold treatment (icing) relieves pain and reduces inflammation. Cold treatment should be applied for 10 to 15 minutes every 2 to 3 hours for inflammation and pain and immediately after any activity that aggravates your symptoms. Use ice packs or massage the area with a piece of ice (ice massage).  Heat treatment may be used prior to performing the stretching and strengthening activities prescribed by your caregiver, physical therapist, or athletic trainer. Use a heat pack or soak the injury in warm water. SEEK IMMEDIATE MEDICAL CARE IF:  Treatment seems to offer no benefit, or the condition worsens.  Any medications produce adverse side effects.   EXERCISES RANGE OF MOTION (ROM) AND STRETCHING EXERCISES - Plantar Fasciitis (Heel Spur Syndrome) These exercises may  help you when beginning to rehabilitate your injury. Your symptoms may resolve with or without further involvement from your physician, physical therapist or athletic trainer. While completing these exercises, remember:   Restoring tissue flexibility helps normal motion to return to the joints. This allows healthier, less painful movement and activity.  An effective stretch should be held for at least 30 seconds.  A stretch should never be painful. You should only feel a gentle lengthening or release in the stretched tissue. RANGE OF MOTION - Toe Extension, Flexion  Sit with your right / left leg crossed over your opposite knee.  Grasp your toes and gently pull them back toward the top of your foot. You should feel a stretch on the bottom of your toes and/or foot.  Hold this stretch for __________ seconds.  Now, gently pull your toes toward the bottom of your foot. You should feel a stretch on the top of your toes and or foot.  Hold this stretch for __________ seconds. Repeat __________ times. Complete this stretch __________ times per day.  RANGE OF MOTION - Ankle Dorsiflexion, Active Assisted  Remove shoes and sit on a chair that is preferably not on a carpeted surface.  Place right / left foot under knee. Extend your opposite leg for support.  Keeping your heel down, slide your right / left foot back toward the chair until you feel a stretch at your ankle or calf. If you do not feel a stretch, slide your bottom forward to the edge of the chair, while still keeping your heel down.  Hold this stretch for __________ seconds. Repeat __________ times. Complete this stretch __________ times per day.  STRETCH - Gastroc, Standing  Place hands on wall.  Extend right / left leg, keeping the front knee somewhat bent.  Slightly point your toes inward on your back foot.  Keeping your right / left heel on the floor and your knee straight, shift your weight toward the wall, not allowing your  back to arch.  You should feel a gentle stretch in the right / left calf. Hold this position for __________ seconds. Repeat __________ times. Complete this stretch __________ times per day. STRETCH - Soleus, Standing  Place hands on wall.  Extend right / left leg, keeping the other knee somewhat bent.  Slightly point your toes inward on your back foot.  Keep your right / left heel on the floor, bend your back knee, and slightly shift your weight over the back leg so that you feel a gentle stretch deep in your back calf.  Hold this position for __________ seconds. Repeat __________ times. Complete this stretch __________ times per day. STRETCH - Gastrocsoleus, Standing  Note: This exercise can place a lot of stress on your foot and ankle. Please complete this exercise only if specifically instructed by your caregiver.   Place the ball of your right / left foot on a step, keeping your other foot firmly on the same step.  Hold on to the wall or a rail for balance.  Slowly lift your other foot, allowing your body weight to press your heel down over the edge of the step.  You should feel a stretch in your right / left calf.  Hold this position for __________ seconds.  Repeat this exercise with a slight bend in your right / left knee. Repeat __________ times. Complete this stretch __________ times   per day.  STRENGTHENING EXERCISES - Plantar Fasciitis (Heel Spur Syndrome)  These exercises may help you when beginning to rehabilitate your injury. They may resolve your symptoms with or without further involvement from your physician, physical therapist or athletic trainer. While completing these exercises, remember:   Muscles can gain both the endurance and the strength needed for everyday activities through controlled exercises.  Complete these exercises as instructed by your physician, physical therapist or athletic trainer. Progress the resistance and repetitions only as  guided. STRENGTH - Towel Curls  Sit in a chair positioned on a non-carpeted surface.  Place your foot on a towel, keeping your heel on the floor.  Pull the towel toward your heel by only curling your toes. Keep your heel on the floor.  If instructed by your physician, physical therapist or athletic trainer, add ____________________ at the end of the towel. Repeat __________ times. Complete this exercise __________ times per day. STRENGTH - Ankle Inversion  Secure one end of a rubber exercise band/tubing to a fixed object (table, pole). Loop the other end around your foot just before your toes.  Place your fists between your knees. This will focus your strengthening at your ankle.  Slowly, pull your big toe up and in, making sure the band/tubing is positioned to resist the entire motion.  Hold this position for __________ seconds.  Have your muscles resist the band/tubing as it slowly pulls your foot back to the starting position. Repeat __________ times. Complete this exercises __________ times per day.    This information is not intended to replace advice given to you by your health care provider. Make sure you discuss any questions you have with your health care provider.   Document Released: 01/07/2005 Document Revised: 05/24/2014 Document Reviewed: 04/21/2008 Elsevier Interactive Patient Education 2016 [employer]  Total Knee Replacement  Total knee replacement is a surgery to replace your damaged knee joint. Your knee joint is replaced with a man-made (artificial) knee joint. The man-made knee joint is called a prosthesis. This surgery is done to lessen pain and improve movement. BEFORE THE PROCEDURE   Do not eat or drink anything after midnight on the night before the procedure or as told by your doctor.  Ask your doctor about:  Changing or stopping your normal medicines. This is important if you take diabetes medicines or blood thinners.  Taking aspirin or ibuprofen  medicines. These thin your blood. Do not take these medicines if your doctor tells you not to.  Plan to have someone take you home after the procedure.  Ask your health care team how your surgery site will be marked.  You may be given medicines that kill germs (antibiotics) to help prevent infection. PROCEDURE   To help prevent infection:  Your health care team will wash or sanitize their hands.  Your skin will be washed with soap.  An IV tube will be put into one of your veins.  You will be given one or more of the following:  Sedative. This is a medicine that makes you relaxed.  General anesthetic. This is a medicine that makes you fall asleep.  Spinal anesthetic. This is a medicine that numbs your body below the waist.  Nerve block. This is a medicine to block feeling in your leg. This medicine helps to ease pain after surgery.  A cut (incision) will be made in the front of your knee. Your surgeon will take out any damaged parts of your knee joint.  Your surgeon  will then:  Put new metal liner over the part of the thigh bone that is taken out.  Put a plastic liner over the shin bone.  Your surgeon may put a plastic piece over the surface of your knee cap.  The cut will be closed. A bandage will be placed over it. AFTER THE PROCEDURE   You will stay in a recovery area until your medicines wear off.  You may have tubes to drain fluid from your knee.  Once you are doing okay, you will be taken to your hospital room.  You may be told to take actions to help prevent blood clots. These may include:  Walking soon after surgery with someone helping you. Moving around helps to improve blood flow.  Taking medicines to thin your blood (anticoagulants).  Wearing special socks (compression stockings) or using other types of devices.  You may need to do exercise therapy (physical therapy) until you are doing well. Your doctor will tell you when you are well enough to go  home.   This information is not intended to replace advice given to you by your health care provider. Make sure you discuss any questions you have with your health care provider.   Document Released: 04/01/2011 Document Revised: 09/28/2014 Document Reviewed: 04/01/2011 Elsevier Interactive Patient Education 2016 [employer]  Arthritis Arthritis means joint pain. It can also mean joint disease. A joint is a place where bones come together. People who have arthritis may have:  Red joints.  Swollen joints.  Stiff joints.  Warm joints.  A fever.  A feeling of being sick. HOME CARE Pay attention to any changes in your symptoms. Take these actions to help with your pain and swelling. Medicines  Take over-the-counter and prescription medicines only as told by your doctor.  Do not take aspirin for pain if your doctor says that you may have gout. Activities  Rest your joint if your doctor tells you to.  Avoid activities that make the pain worse.  Exercise your joint regularly as told by your doctor. Try doing exercises like:  Swimming.  Water aerobics.  Biking.  Walking. Joint Care  If your joint is swollen, keep it raised (elevated) if told by your doctor.  If your joint feels stiff in the morning, try taking a warm shower.  If you have diabetes, do not apply heat without asking your doctor.  If told, apply heat to the joint:  Put a towel between the joint and the hot pack or heating pad.  Leave the heat on the area for 20-30 minutes.  If told, apply ice to the joint:  Put ice in a plastic bag.  Place a towel between your skin and the bag.  Leave the ice on for 20 minutes, 2-3 times per day.  Keep all follow-up visits as told by your doctor. GET HELP IF:  The pain gets worse.  You have a fever. GET HELP RIGHT AWAY IF:  You have very bad pain in your joint.  You have swelling in your joint.  Your joint is red.  Many joints become painful and  swollen.  You have very bad back pain.  Your leg is very weak.  You cannot control your pee (urine) or poop (stool).   This information is not intended to replace advice given to you by your health care provider. Make sure you discuss any questions you have with your health care provider.   Document Released: 04/03/2009 Document Revised: 09/28/2014 Document Reviewed:  04/04/2014 Elsevier Interactive Patient Education [employer].

## 2014-12-21 ENCOUNTER — Encounter: Payer: Self-pay | Admitting: Podiatry

## 2014-12-21 ENCOUNTER — Ambulatory Visit (INDEPENDENT_AMBULATORY_CARE_PROVIDER_SITE_OTHER): Payer: Medicaid Other | Admitting: Podiatry

## 2014-12-21 DIAGNOSIS — M25561 Pain in right knee: Secondary | ICD-10-CM | POA: Insufficient documentation

## 2014-12-21 DIAGNOSIS — E1142 Type 2 diabetes mellitus with diabetic polyneuropathy: Secondary | ICD-10-CM

## 2014-12-21 DIAGNOSIS — M722 Plantar fascial fibromatosis: Secondary | ICD-10-CM | POA: Diagnosis not present

## 2014-12-21 MED ORDER — GABAPENTIN 300 MG PO CAPS: 300.0000 mg | ORAL_CAPSULE | Freq: Three times a day (TID) | ORAL | Status: DC

## 2014-12-21 NOTE — Assessment & Plan Note (Signed)
See xrays done 12/20/14; refer to ortho

## 2014-12-21 NOTE — Telephone Encounter (Signed)
Pt called would like a return call from Dr. Sanda Klein ASAP.

## 2014-12-21 NOTE — Telephone Encounter (Signed)
I spoke with patient; she had xrays done yesterday at urgent care; she would like to see ortho ASAP so she can get feeling better and not lose her job; referral entered; sending this note to [employer] since original referral was not marked high importance

## 2014-12-21 NOTE — Progress Notes (Signed)
She presents today for follow-up of her bilateral plantar fasciitis states that the right foot primarily is the one hurts around the heel but she states that both burn like crazy since she started working at [employer]. She also states that her blood sugars not well controlled.  Objective: Vital signs are stable she is alert and oriented 3 pulses are palpable. Neurologic sensorium is decreased per Semmes-Weinstein monofilament. She has pain on palpation medial calcaneal tubercle of the right heel. I evaluated her previous labs taken just the other day demonstrating an 8.3% hemoglobin A1c.  Assessment: Chronic intractable plantar fasciitis right heel. Severe diabetic peripheral neuropathy painful burning.  Plan: I encouraged her to start the Celebrex provided by her primary provider. I also encouraged her to start taking 300 mg gabapentin 3 times a day. I injected her right heel today with Kenalog and local anesthetic. We'll follow up with her in 1 month.

## 2014-12-21 NOTE — Telephone Encounter (Signed)
Routing to provider  

## 2014-12-26 ENCOUNTER — Other Ambulatory Visit: Payer: Self-pay | Admitting: Family Medicine

## 2014-12-26 NOTE — Telephone Encounter (Signed)
Routing to provider  

## 2014-12-27 NOTE — Telephone Encounter (Signed)
Last cmp reviewed; rx approved 

## 2014-12-29 ENCOUNTER — Telehealth: Payer: Self-pay | Admitting: Family Medicine

## 2014-12-29 MED ORDER — ACCU-CHEK NANO SMARTVIEW W/DEVICE KIT: 1.0000 | PACK | Freq: Every day | Status: DC

## 2014-12-29 MED ORDER — METFORMIN HCL ER 500 MG PO TB24: 1000.0000 mg | ORAL_TABLET | Freq: Every day | ORAL | Status: DC

## 2014-12-29 MED ORDER — VITAMIN D (ERGOCALCIFEROL) 1.25 MG (50000 UNIT) PO CAPS: 50000.0000 [IU] | ORAL_CAPSULE | ORAL | Status: DC

## 2014-12-29 NOTE — Telephone Encounter (Signed)
A1c has gone up; start back on metformin Vit D low, start Rx for 2 months Accucheck Nano requested; Rx sent I spoke with patient by phone, reviewed all labs

## 2015-01-03 NOTE — Progress Notes (Signed)
This encounter was created in error - please disregard.

## 2015-01-09 ENCOUNTER — Other Ambulatory Visit: Payer: Self-pay | Admitting: Unknown Physician Specialty

## 2015-01-09 DIAGNOSIS — M25561 Pain in right knee: Secondary | ICD-10-CM

## 2015-01-12 ENCOUNTER — Ambulatory Visit: Payer: Medicaid Other | Admitting: Family Medicine

## 2015-01-17 ENCOUNTER — Ambulatory Visit
Admission: RE | Admit: 2015-01-17 | Discharge: 2015-01-17 | Disposition: A | Discharge status: Home / Self Care | Payer: Medicaid Other | Source: Ambulatory Visit | Attending: Unknown Physician Specialty | Admitting: Unknown Physician Specialty

## 2015-01-17 DIAGNOSIS — M25561 Pain in right knee: Secondary | ICD-10-CM | POA: Diagnosis present

## 2015-01-17 DIAGNOSIS — Z01818 Encounter for other preprocedural examination: Secondary | ICD-10-CM | POA: Diagnosis present

## 2015-01-17 DIAGNOSIS — M1711 Unilateral primary osteoarthritis, right knee: Secondary | ICD-10-CM | POA: Diagnosis not present

## 2015-01-18 ENCOUNTER — Encounter: Payer: Self-pay | Admitting: Podiatry

## 2015-01-18 ENCOUNTER — Ambulatory Visit (INDEPENDENT_AMBULATORY_CARE_PROVIDER_SITE_OTHER): Payer: Medicaid Other | Admitting: Podiatry

## 2015-01-18 VITALS — BP 161/88 | HR 79 | Resp 18

## 2015-01-18 DIAGNOSIS — E1142 Type 2 diabetes mellitus with diabetic polyneuropathy: Secondary | ICD-10-CM

## 2015-01-18 DIAGNOSIS — B351 Tinea unguium: Secondary | ICD-10-CM | POA: Diagnosis not present

## 2015-01-18 DIAGNOSIS — M79676 Pain in unspecified toe(s): Secondary | ICD-10-CM | POA: Diagnosis not present

## 2015-01-18 DIAGNOSIS — M722 Plantar fascial fibromatosis: Secondary | ICD-10-CM | POA: Diagnosis not present

## 2015-01-18 NOTE — Progress Notes (Signed)
She presents today for follow-up of her left heel pain and numbness and tingling to the bilateral lower extremity. She states that the gabapentin seems to be helping but it has not resolved the problem yet. She states that her blood sugar is currently an 8.0. She is complaining of painfully elongated toenails as well. States that she is about to enter the hospital for a hemiarthroplasty right knee.  Objective: Vital signs are stable alert and oriented 3. Pulses are strongly palpable. No change in neurologic status. Mild tenderness on palpation medial calcaneal tubercle of the left heel but minimally so. Her toenails are thick yellow dystrophic with mycotic plates.  Assessment: Diabetic peripheral neuropathy bilateral. Pain in limb secondary to onychomycosis and peripheral neuropathy. Plan fascitis left.  Plan: At this point I encouraged her to leave things as they are and we will reevaluate her after her knee surgery. We debrided her toenails today 1 through 5 bilateral covered service secondary to pain.

## 2015-01-19 ENCOUNTER — Ambulatory Visit (INDEPENDENT_AMBULATORY_CARE_PROVIDER_SITE_OTHER): Payer: Medicaid Other | Admitting: Family Medicine

## 2015-01-19 ENCOUNTER — Encounter: Payer: Self-pay | Admitting: Family Medicine

## 2015-01-19 VITALS — BP 127/83 | HR 100 | Temp 97.9°F | Wt 272.0 lb

## 2015-01-19 DIAGNOSIS — I10 Essential (primary) hypertension: Secondary | ICD-10-CM

## 2015-01-19 DIAGNOSIS — D485 Neoplasm of uncertain behavior of skin: Secondary | ICD-10-CM

## 2015-01-19 DIAGNOSIS — M25561 Pain in right knee: Secondary | ICD-10-CM

## 2015-01-19 DIAGNOSIS — D492 Neoplasm of unspecified behavior of bone, soft tissue, and skin: Secondary | ICD-10-CM

## 2015-01-19 DIAGNOSIS — R718 Other abnormality of red blood cells: Secondary | ICD-10-CM | POA: Diagnosis not present

## 2015-01-19 DIAGNOSIS — Z01818 Encounter for other preprocedural examination: Secondary | ICD-10-CM

## 2015-01-19 DIAGNOSIS — L732 Hidradenitis suppurativa: Secondary | ICD-10-CM

## 2015-01-19 LAB — MICROSCOPIC EXAMINATION: WBC, UA: NONE SEEN /hpf (ref 0–?)

## 2015-01-19 LAB — UA/M W/RFLX CULTURE, ROUTINE
BILIRUBIN UA: NEGATIVE
GLUCOSE, UA: NEGATIVE
Leukocytes, UA: NEGATIVE
NITRITE UA: NEGATIVE
Protein, UA: NEGATIVE
SPEC GRAV UA: 1.025 (ref 1.005–1.030)
Urobilinogen, Ur: 0.2 mg/dL (ref 0.2–1.0)
pH, UA: 6 (ref 5.0–7.5)

## 2015-01-19 MED ORDER — SULFAMETHOXAZOLE-TRIMETHOPRIM 800-160 MG PO TABS: 1.0000 | ORAL_TABLET | Freq: Two times a day (BID) | ORAL | Status: DC

## 2015-01-19 NOTE — Assessment & Plan Note (Addendum)
Start antibiotics and refer to dermatologist; she will have been on antibiotics for five days by the time she is scheduled for lower extremity procedure; she should do phisohex or similar surgical bathing prior to procedure, and this area can be kept covered, perhaps with op site before entering OR; if worsening, then procedure will need to be delayed to reduce the risk of contamination, infection

## 2015-01-19 NOTE — Assessment & Plan Note (Addendum)
Very dark macular lesion in right axilla; refer to Dr. Otho Ket in [facility] for evaluation

## 2015-01-19 NOTE — Progress Notes (Signed)
BP 127/83 mmHg  Pulse 100  Temp(Src) 97.9 F (36.6 C)  Wt 272 lb (123.378 kg)  SpO2 96%  LMP 05/19/2013   Subjective:    Patient ID: [name] Golden, female    DOB: 01-03-1976, 39 y.o.   MRN: [id number]  HPI: [name] Golden is a 39 y.o. female  Chief Complaint  Patient presents with  . Surgical Clearance    She is having knee replacement surgery on 01/24/15.   She is having right knee surgery next week; she has early onset arthritis and torn medial meniscus and torn ACL; Dr. 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 coma; she was rushed to hospital in May of that year with blood clots in legs and lungs; she was on blood thinners from 2004 until 2006; no further episodes; no family hx of blood clots; on her father's side, there is a history of easy bleeders; she might have to check on that; her grandmother and sister are easy to bruise  She does have the hidradenitis, under both arms; no fevers; no sores from the waist down; does have a few spots that have flared up under both underarms  No dysuria; she is coughing a little up, getting a lot better No chest pain; no trouble breathing  She will go to pre-op tomorrow; Dr. Scharlene Gloss nurse will let her know the time to be there  She has type 2 diabetes mellitus, sugars have been very well-controlled; says her highest sugar was 184, after chunky monkey ice cream; usually 95-140  Vit D supplementation; done with 4th week today of the Rx  She just saw podiatrist; no foot problems  Relevant past medical, surgical, family and social history reviewed and updated as indicated. Interim medical history since our last visit  reviewed. Allergies and medications reviewed and updated.  Review of Systems Per HPI unless specifically indicated above     Objective:    BP 127/83 mmHg  Pulse 100  Temp(Src) 97.9 F (36.6 C)  Wt 272 lb (123.378 kg)  SpO2 96%  LMP 05/19/2013  Wt Readings from Last 3 Encounters:  01/23/15 273 lb (123.832 kg)  01/19/15 272 lb (123.378 kg)  12/20/14 274 lb (124.286 kg)  body mass index is 42.59 kg/(m^2).  Physical Exam  Constitutional: She appears well-developed and well-nourished.  Morbidly obese, weight stable  HENT:  Head: Normocephalic and atraumatic.  Right Ear: External ear normal.  Nose: Nose normal.  Mouth/Throat: Oropharynx is clear and moist.  Eyes: EOM are normal. No scleral icterus.  Neck: No JVD present.  Cardiovascular: Normal rate and regular rhythm.   Pulmonary/Chest: Effort normal and breath sounds normal. She has no wheezes.  Abdominal: Soft. Bowel sounds are normal. She exhibits no distension.  Musculoskeletal: She exhibits no edema.  Lymphadenopathy:    She has no cervical adenopathy.  Neurological: She is alert.  Skin: No pallor.  Two pustules right axilla and one left axilla; no fluctuance; under the right underarm, 3x4 mm very dark macule  Psychiatric: She has a normal mood and affect. Her behavior is normal.      Assessment & Plan:   Problem List Items Addressed This Visit  Cardiovascular and Mediastinum   Essential hypertension, benign    Well-controlled, continue current regimen; weight loss would be helpful, as well as DASH guidelines        Musculoskeletal and Integument   Neoplasm of skin of axilla    Very dark macular lesion in right axilla; refer to Dr. Otho Ket in [facility] for evaluation      Relevant Orders   Ambulatory referral to Dermatology   Hidradenitis suppurativa    Start antibiotics and refer to dermatologist; she will have been on antibiotics for five days by the time she is scheduled for lower extremity  procedure; she should do phisohex or similar surgical bathing prior to procedure, and this area can be kept covered, perhaps with op site before entering OR; if worsening, then procedure will need to be delayed to reduce the risk of contamination, infection      Relevant Medications   sulfamethoxazole-trimethoprim (BACTRIM DS) 800-160 MG tablet   Other Relevant Orders   Ambulatory referral to Dermatology     Other   Microcytosis    Request records from hematologist work-up done a few years ago      Knee pain, right    With early onset OA, torn meniscus, and torn ACL; plan for surgery next week; I give medical clearance with the caveat that she has the hidradenitis under the armpits and we're starting antibiotics for that five prior pre-op; these areas will need to be covered, and patient to do surgical prep showering prior to procedure per surgeon's directions; her diabetes is controlled, HTN controlled, and she'll need the routine DVT prophylaxis prior to surgery and DVT prophylaxis after surgery       Other Visit Diagnoses    Pre-op evaluation    -  Primary    Relevant Orders    CBC with Differential/Platelet (Completed)    PT and PTT (Completed)    Comprehensive metabolic panel (Completed)    UA/M w/rflx Culture, Routine (Completed)       Follow up plan: No Follow-up on file.  An after-visit summary was printed and given to the patient at [place].  Please see the patient instructions which may contain other information and recommendations beyond what is mentioned above in the assessment and plan.

## 2015-01-19 NOTE — Patient Instructions (Addendum)
We'll get the labs today Start the antibiotics I do recommend that you do a pre-surgical scrub/shower with antibacterial soap the night before and the day of your surgery; please talk with your surgeon or pre-op coordinator I will give preliminary operative clearance today, but want you to start the antibiotics and we'll get labs; if infection under the arms does not clear up or your labs are abnormal, then we'll want to postpone your surgery Your surgeon may call me at [phone] No fish oil, no vitamin E, and no salmon before surgery Return to see me on or after Feb 19th for diabetes

## 2015-01-20 ENCOUNTER — Telehealth: Payer: Self-pay | Admitting: Family Medicine

## 2015-01-20 LAB — CBC WITH DIFFERENTIAL/PLATELET
BASOS ABS: 0 10*3/uL (ref 0.0–0.2)
BASOS: 0 %
EOS (ABSOLUTE): 0.1 10*3/uL (ref 0.0–0.4)
Eos: 1 %
Hematocrit: 38.2 % (ref 34.0–46.6)
Hemoglobin: 12.7 g/dL (ref 11.1–15.9)
IMMATURE GRANS (ABS): 0.1 10*3/uL (ref 0.0–0.1)
Immature Granulocytes: 1 %
LYMPHS ABS: 3.8 10*3/uL — AB (ref 0.7–3.1)
LYMPHS: 37 %
MCH: 24.7 pg — AB (ref 26.6–33.0)
MCHC: 33.2 g/dL (ref 31.5–35.7)
MCV: 74 fL — AB (ref 79–97)
Monocytes Absolute: 0.6 10*3/uL (ref 0.1–0.9)
Monocytes: 6 %
NEUTROS ABS: 5.5 10*3/uL (ref 1.4–7.0)
Neutrophils: 55 %
PLATELETS: 376 10*3/uL (ref 150–379)
RBC: 5.14 x10E6/uL (ref 3.77–5.28)
RDW: 15.3 % (ref 12.3–15.4)
WBC: 10.1 10*3/uL (ref 3.4–10.8)

## 2015-01-20 LAB — COMPREHENSIVE METABOLIC PANEL
ALBUMIN: 4 g/dL (ref 3.5–5.5)
ALT: 8 IU/L (ref 0–32)
AST: 8 IU/L (ref 0–40)
Albumin/Globulin Ratio: 1.4 (ref 1.1–2.5)
Alkaline Phosphatase: 82 IU/L (ref 39–117)
BUN / CREAT RATIO: 12 (ref 8–20)
BUN: 9 mg/dL (ref 6–20)
Bilirubin Total: 0.2 mg/dL (ref 0.0–1.2)
CO2: 21 mmol/L (ref 18–29)
CREATININE: 0.74 mg/dL (ref 0.57–1.00)
Calcium: 9.8 mg/dL (ref 8.7–10.2)
Chloride: 100 mmol/L (ref 96–106)
GFR calc non Af Amer: 102 mL/min/{1.73_m2} (ref >59)
GFR, EST AFRICAN AMERICAN: 118 mL/min/{1.73_m2} (ref >59)
GLOBULIN, TOTAL: 2.9 g/dL (ref 1.5–4.5)
GLUCOSE: 162 mg/dL — AB (ref 65–99)
Potassium: 4.4 mmol/L (ref 3.5–5.2)
SODIUM: 141 mmol/L (ref 134–144)
TOTAL PROTEIN: 6.9 g/dL (ref 6.0–8.5)

## 2015-01-20 LAB — PT AND PTT
INR: 1 (ref 0.8–1.2)
Prothrombin Time: 10.4 s (ref 9.1–12.0)
aPTT: 28 s (ref 24–33)

## 2015-01-20 NOTE — Telephone Encounter (Signed)
I am not sure who Freda Munro is I called the number, no answer ------------------------ Christan -- who is Freda Munro?

## 2015-01-20 NOTE — Telephone Encounter (Signed)
-----   Message from Stark Klein sent at 01/20/2015 10:46 AM EST ----- Contact: [phone] Pt has a lot of issues that may prolong her surgery please call sheila ASAP

## 2015-01-23 ENCOUNTER — Emergency Department: Payer: Medicaid Other

## 2015-01-23 ENCOUNTER — Encounter: Payer: Self-pay | Admitting: Emergency Medicine

## 2015-01-23 ENCOUNTER — Emergency Department
Admission: EM | Admit: 2015-01-23 | Discharge: 2015-01-23 | Disposition: A | Discharge status: Home / Self Care | Payer: Medicaid Other | Attending: Emergency Medicine | Admitting: Emergency Medicine

## 2015-01-23 DIAGNOSIS — I10 Essential (primary) hypertension: Secondary | ICD-10-CM | POA: Diagnosis not present

## 2015-01-23 DIAGNOSIS — E119 Type 2 diabetes mellitus without complications: Secondary | ICD-10-CM | POA: Insufficient documentation

## 2015-01-23 DIAGNOSIS — Z7984 Long term (current) use of oral hypoglycemic drugs: Secondary | ICD-10-CM | POA: Insufficient documentation

## 2015-01-23 DIAGNOSIS — Z79899 Other long term (current) drug therapy: Secondary | ICD-10-CM | POA: Insufficient documentation

## 2015-01-23 DIAGNOSIS — Z880 Allergy status to penicillin: Secondary | ICD-10-CM | POA: Insufficient documentation

## 2015-01-23 DIAGNOSIS — Z791 Long term (current) use of non-steroidal anti-inflammatories (NSAID): Secondary | ICD-10-CM | POA: Insufficient documentation

## 2015-01-23 DIAGNOSIS — Z87891 Personal history of nicotine dependence: Secondary | ICD-10-CM | POA: Insufficient documentation

## 2015-01-23 DIAGNOSIS — Z792 Long term (current) use of antibiotics: Secondary | ICD-10-CM | POA: Diagnosis not present

## 2015-01-23 DIAGNOSIS — R200 Anesthesia of skin: Secondary | ICD-10-CM | POA: Diagnosis present

## 2015-01-23 DIAGNOSIS — R202 Paresthesia of skin: Secondary | ICD-10-CM | POA: Insufficient documentation

## 2015-01-23 LAB — CBC
HEMATOCRIT: 38.1 % (ref 35.0–47.0)
Hemoglobin: 12.4 g/dL (ref 12.0–16.0)
MCH: 24.8 pg — ABNORMAL LOW (ref 26.0–34.0)
MCHC: 32.6 g/dL (ref 32.0–36.0)
MCV: 76.3 fL — AB (ref 80.0–100.0)
PLATELETS: 299 10*3/uL (ref 150–440)
RBC: 4.99 MIL/uL (ref 3.80–5.20)
RDW: 15.5 % — AB (ref 11.5–14.5)
WBC: 9.6 10*3/uL (ref 3.6–11.0)

## 2015-01-23 LAB — COMPREHENSIVE METABOLIC PANEL
ALBUMIN: 4.2 g/dL (ref 3.5–5.0)
ALK PHOS: 60 U/L (ref 38–126)
ALT: 14 U/L (ref 14–54)
ANION GAP: 5 (ref 5–15)
AST: 16 U/L (ref 15–41)
BILIRUBIN TOTAL: 0.7 mg/dL (ref 0.3–1.2)
BUN: 9 mg/dL (ref 6–20)
CALCIUM: 9.5 mg/dL (ref 8.9–10.3)
CO2: 25 mmol/L (ref 22–32)
Chloride: 106 mmol/L (ref 101–111)
Creatinine, Ser: 0.83 mg/dL (ref 0.44–1.00)
GFR calc Af Amer: >60 mL/min (ref >60)
GFR calc non Af Amer: >60 mL/min (ref >60)
GLUCOSE: 131 mg/dL — AB (ref 65–99)
Potassium: 4 mmol/L (ref 3.5–5.1)
SODIUM: 136 mmol/L (ref 135–145)
TOTAL PROTEIN: 7.7 g/dL (ref 6.5–8.1)

## 2015-01-23 NOTE — ED Provider Notes (Signed)
[facility] Emergency Department Provider Note  Time seen: 7:12 PM  I have reviewed the triage vital signs and the nursing notes.   HISTORY  Chief Complaint Abscess and Numbness    HPI [name] Golden is a 40 y.o. female with a past medical history of anxiety, depression, bipolar, DVT, PE, off Coumadin since 2006, gastric reflux, supportive hidradenitis, diabetes, arthritis, who presents the emergency department with paresthesias in her right lower extremity. According to the patient she had a small abscess to her posterior right upper thigh. She had her husband express fluid out of it last night and it has drained since then. States the pain is decreased from the abscess but today she has noted paresthesias in her right lower extremity which have worsened. Patient states overall she has been having intermittent paresthesias for the past 2 weeks. States they have mostly been in her right foot. She sees a podiatrist who says this is likely neuropathy related. However today the patient noticed that the paresthesias extended up her leg towards her thigh. Patient has a history of blood clots in the past was concerned this could be a symptom. Patient called her primary care physician who referred her to the emergency department for evaluation. Patient denies any confusion, headache, slurred speech, weakness or numbness. Describes the paresthesias as mild to moderate.     Past Medical History  Diagnosis Date  . Anxiety   . Depression   . Osteoporosis   . Vitamin D deficiency   . Bipolar 1 disorder ([place])   . Heavy periods   . Hypertension   . History of DVT (deep vein thrombosis) 2004    leg, neck, arm  . History of pulmonary embolus (PE) 2004  . Endometriosis   . Painful intercourse   . Painful menstrual periods   . High cholesterol   . GERD (gastroesophageal reflux disease)   . History of ovarian cyst   . Increased BMI (body mass index)   . Menopausal  symptom   . Heart murmur     as child  . Sleep apnea     CPAP  . Diabetes ([place])     diet controlled  . Headache     sporadic - stress/allergies  . Arthritis     right foot and knee    Patient Active Problem List   Diagnosis Date Noted  . Neoplasm of skin of axilla 01/19/2015  . Hidradenitis suppurativa 01/19/2015  . Knee pain, right 12/21/2014  . Constipation 11/27/2014  . Vitamin D deficiency 11/27/2014  . Microcytosis 11/27/2014  . Elevated ferritin 11/27/2014  . Change in bowel habits   . Chronic abdominal pain 09/14/2014  . Posttraumatic stress disorder 09/14/2014  . Diabetes mellitus type 2, uncontrolled ([place]) 09/14/2014  . Hyperlipidemia 09/14/2014  . Morbid obesity ([place]) 09/14/2014  . Essential hypertension, benign 09/14/2014  . Acne inversa 08/26/2012    Past Surgical History  Procedure Laterality Date  . Breast surgery    . Appendectomy    . Abdominal hysterectomy    . Laparoscopic adhesions removal for endometriosis    . Colonoscopy with propofol N/A 11/17/2014    Procedure: COLONOSCOPY WITH PROPOFOL;  Surgeon: Lucilla Lame, MD;  Location: [place] [facility];  Service: Endoscopy;  Laterality: N/A;  CPAP Diabetic - diet controlled    Current Outpatient Rx  Name  Route  Sig  Dispense  Refill  . atorvastatin (LIPITOR) 10 MG tablet      TAKE 1 TABLET BY MOUTH  AT BEDTIME FOR CHOLESTEROL   30 tablet   2   . Blood Glucose Monitoring Suppl (ACCU-CHEK NANO SMARTVIEW) W/DEVICE KIT   Does not apply   1 kit by Does not apply route daily. LON 99 months, diagnosis E11.65   1 kit   0     Patient is on her way over   . clonazePAM (KLONOPIN) 0.5 MG tablet   Oral   Take 0.5 mg by mouth 2 (two) times daily as needed.         . gabapentin (NEURONTIN) 300 MG capsule   Oral   Take 1 capsule (300 mg total) by mouth 3 (three) times daily.   90 capsule   3   . hydrochlorothiazide (HYDRODIURIL) 25 MG tablet   Oral   Take 1 tablet (25 mg total) by mouth  daily.   30 tablet   6   . meloxicam (MOBIC) 15 MG tablet   Oral   Take 1 tablet (15 mg total) by mouth daily.   30 tablet   3   . metFORMIN (GLUCOPHAGE XR) 500 MG 24 hr tablet   Oral   Take 2 tablets (1,000 mg total) by mouth daily with breakfast.   60 tablet   3   . polyethylene glycol powder (GLYCOLAX/MIRALAX) powder   Oral   Take 17 g by mouth daily. Mixed in 4-8 ounces of water, drink once a day   500 g   1   . QUEtiapine (SEROQUEL) 300 MG tablet   Oral   Take 300 mg by mouth at bedtime.         . sulfamethoxazole-trimethoprim (BACTRIM DS) 800-160 MG tablet   Oral   Take 1 tablet by mouth 2 (two) times daily. Stay well-hydrated   14 tablet   0   . traMADol (ULTRAM) 50 MG tablet   Oral   Take 50 mg by mouth every 6 (six) hours as needed.         . Vitamin D, Ergocalciferol, (DRISDOL) 50000 UNITS CAPS capsule   Oral   Take 1 capsule (50,000 Units total) by mouth every 7 (seven) days.   4 capsule   1     Allergies Allegra; Codeine; Hydrocodone; Clindamycin/lincomycin; Tomato; and Penicillins  Family History  Problem Relation Age of Onset  . Diabetes Mother   . Hypertension Mother   . Cancer Mother     cervical  . Hypertension Father   . Cancer Maternal Aunt     pancreatic  . Diabetes Paternal Aunt   . Cancer Paternal Aunt     breast  . Heart disease Paternal Uncle     hole in heart  . Hypertension Maternal Grandmother   . Cancer Maternal Grandfather     lung  . Hypertension Maternal Grandfather   . Heart disease Paternal Grandmother   . Thyroid disease Paternal Grandmother   . Hypertension Paternal Grandmother   . Diabetes Paternal Grandfather   . Stroke Paternal Grandfather   . Hypertension Paternal Grandfather     Social History Social History  Substance Use Topics  . Smoking status: Former Smoker -- 0.50 packs/day for 23 years    Types: Cigarettes    Quit date: 12/03/2014  . Smokeless tobacco: Never Used     Comment: off and on  smoker since age 51  . Alcohol Use: 0.0 oz/week    0 Standard drinks or equivalent per week     Comment: Occasional    Review of Systems Constitutional:  Negative for fever. Cardiovascular: Negative for chest pain. Respiratory: Negative for shortness of breath. Gastrointestinal: Negative for abdominal pain Neurological: Negative for headaches, focal weakness or numbness.  10-point ROS otherwise negative.  ____________________________________________   PHYSICAL EXAM:  VITAL SIGNS: ED Triage Vitals  Enc Vitals Group     BP 01/23/15 1831 125/65 mmHg     Pulse Rate 01/23/15 1831 84     Resp 01/23/15 1831 18     Temp 01/23/15 1831 98.9 F (37.2 C)     Temp Source 01/23/15 1831 Oral     SpO2 01/23/15 1831 98 %     Weight 01/23/15 1831 273 lb (123.832 kg)     Height 01/23/15 1831 _0  (1.702 m)     Head Cir --      Peak Flow --      Pain Score 01/23/15 1831 8     Pain Loc --      Pain Edu? --      Excl. in [place]? --     Constitutional: Alert and oriented. Well appearing and in no distress. Eyes: Normal exam ENT   Head: Normocephalic and atraumatic   Mouth/Throat: Mucous membranes are moist. Cardiovascular: Normal rate, regular rhythm. No murmur Respiratory: Normal respiratory effort without tachypnea nor retractions. Breath sounds are clear and equal bilaterally. No wheezes/rales/rhonchi. Gastrointestinal: Soft and nontender. No distention.   Musculoskeletal: Nontender with normal range of motion in all extremities. No lower extremity tenderness or edema. Neurologic:  Patient was equal grip strengths bilaterally. No pronator drift. 5/5 motor in all extremities including lower extremities. Sensation intact and equal in bilateral upper and lower extremities per patient. Cranial nerves intact. No neurologic deficits identified. Skin:  Skin is warm, dry and intact.  Psychiatric: Mood and affect are normal. Speech and behavior are  normal ____________________________________________     RADIOLOGY  NEGATIVE FOR DVT  ____________________________________________    INITIAL IMPRESSION / ASSESSMENT AND PLAN / ED COURSE  Pertinent labs & imaging results that were available during my care of the patient were reviewed by me and considered in my medical decision making (see chart for details).  Patient presents the emergency department with paresthesias in her right lower extremity. Also requesting blood work done for "medical clearance." I told the patient we do not do medical clearance for surgery, however I would be happy check some basic labs for her. We will proceed with an OUT of the right lower extremity to rule out DVT as the patient has a significant history of DVT as well as PE in the past. Patient has a normal neurologic exam with no sensory or motor deficits identified. Do not suspect CVA. Patient appears overall very well, no distress. Ambulates without difficulty.  Ultrasound negative for DVT. Labs within normal limits. We'll discharge home with primary care follow-up. Patient is agreeable to plan.  ____________________________________________   FINAL CLINICAL IMPRESSION(S) / ED DIAGNOSES  Right lower extremity paresthesia   Harvest Dark, MD 01/23/15 2126

## 2015-01-23 NOTE — ED Notes (Signed)
Scheduleed to have a right knee replacement in the morning at [place].  Called the nurse on call there because hydronitis suptiva is below waist and numbness to right leg.  Onset of symptoms this morning at 0400.

## 2015-01-23 NOTE — Discharge Instructions (Signed)
Paresthesia Paresthesia is an abnormal burning or prickling sensation. This sensation is generally felt in the hands, arms, legs, or feet. However, it may occur in any part of the body. Usually, it is not painful. The feeling may be described as:  Tingling or numbness.  Pins and needles.  Skin crawling.  Buzzing.  Limbs falling asleep.  Itching. Most people experience temporary (transient) paresthesia at some time in their lives. Paresthesia may occur when you breathe too quickly (hyperventilation). It can also occur without any apparent cause. Commonly, paresthesia occurs when pressure is placed on a nerve. The sensation quickly goes away after the pressure is removed. For some people, however, paresthesia is a long-lasting (chronic) condition that is caused by an underlying disorder. If you continue to have paresthesia, you may need further medical evaluation. HOME CARE INSTRUCTIONS Watch your condition for any changes. Taking the following actions may help to lessen any discomfort that you are feeling:  Avoid drinking alcohol.  Try acupuncture or massage to help relieve your symptoms.  Keep all follow-up visits as directed by your health care provider. This is important. SEEK MEDICAL CARE IF:  You continue to have episodes of paresthesia.  Your burning or prickling feeling gets worse when you walk.  You have pain, cramps, or dizziness.  You develop a rash. SEEK IMMEDIATE MEDICAL CARE IF:  You feel weak.  You have trouble walking or moving.  You have problems with speech, understanding, or vision.  You feel confused.  You cannot control your bladder or bowel movements.  You have numbness after an injury.  You faint.   This information is not intended to replace advice given to you by your health care provider. Make sure you discuss any questions you have with your health care provider.   Document Released: 12/28/2001 Document Revised: 05/24/2014 Document Reviewed:  01/03/2014 Elsevier Interactive Patient Education 2016 Elsevier Inc.  

## 2015-01-24 ENCOUNTER — Telehealth: Payer: Self-pay | Admitting: Family Medicine

## 2015-01-24 ENCOUNTER — Encounter: Payer: Self-pay | Admitting: Family Medicine

## 2015-01-24 DIAGNOSIS — Z86711 Personal history of pulmonary embolism: Secondary | ICD-10-CM | POA: Insufficient documentation

## 2015-01-24 NOTE — Telephone Encounter (Signed)
I requested records from Dr. Beverly Gust work-up from 2014; I noted a few irregularities in her labs and just want someone in hematology to tell me they are normal Please refer them to her note and labs from October 2014; her Protein C functional test is elevated, and her free Protein S is elevated Are these clinically significant? He said the tests were normal, and I just want to be sure these are not clinically significant

## 2015-01-25 ENCOUNTER — Telehealth: Payer: Self-pay | Admitting: Family Medicine

## 2015-01-25 ENCOUNTER — Telehealth: Payer: Self-pay | Admitting: *Deleted

## 2015-01-25 NOTE — Telephone Encounter (Signed)
I spoke back with Nira Conn and she did not see any issues with the tests you mentioned, but she was going to send his note in regards to the labs and see if that answers your question better. If not, she said to call back and you could speak with one of the hematologists.

## 2015-01-25 NOTE — Telephone Encounter (Signed)
Amy from Dr. Delight Ovens. Requesting call back.  Spoke with Amy. Dr. Sanda Klein reviewed chart and patient's abn protein C levels from 10/2012. MD has questions for hematologist re: patient's abn. Labs. Triage RN faxed last office note from Dr. Ma Hillock to Dr. Sanda Klein c/o Amy. Per Dr. Beverly Gust note in 11/2012, "Hypercoagulable workup on 10/23/12 was unremarkable (including anticardiolipin antibody, beta 2 glycoprotein 1 panel, antithrombin, factor II mutation analysis, factor V Leiden mutation, serum homocysteine, lupus anticoagulant, protein C and S panel). Also, venous Doppler of right lower extremity negative for recurrent DVT.  No further workup or continued hematology followup needed and she is being discharged from our clinic." At that time, Dr. Ma Hillock also recommended a "repeat MRI in 3 months to re-evaluate patient's fatty liver" It was also noted that the "patient would discuss the repeat MRI with Dr. Sanda Klein."  This last office note was faxed to Dr. Sanda Klein for her review.  She can contact our office and speak to the oncall doctor if needed.

## 2015-01-25 NOTE — Telephone Encounter (Signed)
Dr.Pandit is no longer there, I left a message for the nurse that is working with the provider that took his place.

## 2015-01-25 NOTE — Telephone Encounter (Signed)
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

## 2015-01-25 NOTE — Assessment & Plan Note (Signed)
Well-controlled, continue current regimen; weight loss would be helpful, as well as DASH guidelines

## 2015-01-25 NOTE — Telephone Encounter (Signed)
I called orthopaedist clinic to check on patient's surgical date, as I see she went to the ER I do want to make sure she received post-op DVT prophylaxis Patient did not show for her surgery, was in the ER; she is welcome to reschedule with them, Dr. Scharlene Gloss staff says They are going to do Lovenox for 21 days, followed by 325 mg aspirin BID; they spoke with vascular about this

## 2015-01-25 NOTE — Assessment & Plan Note (Signed)
With early onset OA, torn meniscus, and torn ACL; plan for surgery next week; I give medical clearance with the caveat that she has the hidradenitis under the armpits and we're starting antibiotics for that five prior pre-op; these areas will need to be covered, and patient to do surgical prep showering prior to procedure per surgeon's directions; her diabetes is controlled, HTN controlled, and she'll need the routine DVT prophylaxis prior to surgery and DVT prophylaxis after surgery

## 2015-01-25 NOTE — Assessment & Plan Note (Signed)
Request records from hematologist work-up done a few years ago

## 2015-01-26 ENCOUNTER — Telehealth: Payer: Self-pay | Admitting: *Deleted

## 2015-01-26 NOTE — Telephone Encounter (Signed)
Dr. Sanda Klein called cancer center at 915. Spoke with Dixie and asked Nira Conn, RN to return her call.  RN-Returned Dr. Delight Ovens phone call at 1137.  She would like Dr. Mike Gip to review patient's chart, labs and Dr. Beverly Gust note. Patient will be schedule for orthopedic surgery within the next 2 weeks. Most likely, the patient will need Lovenox injection. H/o elevated protein S/C levels. Personal h/o DVT and PE in 2004.  Spoke with Dr. Mike Gip. She will be happy to review the pt's chart and provide feedback to Dr. Sanda Klein.

## 2015-01-26 NOTE — Telephone Encounter (Signed)
Patient's care was discussed by Dr. Mike Gip with Dr. Sanda Klein.

## 2015-01-26 NOTE — Telephone Encounter (Signed)
Dr. Mike Gip was gracious and called me; no Protein C or S deficiency; the mildly elev numbers not a concern; post-op DVT prophylaxis; no further work-up

## 2015-02-07 ENCOUNTER — Other Ambulatory Visit: Payer: Self-pay | Admitting: Family Medicine

## 2015-02-07 NOTE — Telephone Encounter (Signed)
Jan 19, 2015 ALT was 8; Rx approved

## 2015-02-08 ENCOUNTER — Telehealth: Payer: Self-pay

## 2015-02-08 DIAGNOSIS — L732 Hidradenitis suppurativa: Secondary | ICD-10-CM

## 2015-02-08 NOTE — Telephone Encounter (Signed)
I just entered referral I hope her surgery goes well; let her know that we'll be thinking of her

## 2015-02-08 NOTE — Telephone Encounter (Signed)
They had to reschedule her knee surgery to this Monday. She states she is having a break out of her Hidradenitis supprative and they want her to be seen by dermatology BEFORE Monday. She has seen Dr.Sayed in [place] in the past. Needs a referral entered in because she has not seen them in over a year. Their number is [phone]

## 2015-02-08 NOTE — Assessment & Plan Note (Signed)
Refer to Dr. Otho Ket

## 2015-02-09 NOTE — Telephone Encounter (Signed)
Tried to call patient but the phone just kept ringing and there was no voicemail. Will try to call again later.

## 2015-02-10 ENCOUNTER — Telehealth: Payer: Self-pay | Admitting: *Deleted

## 2015-02-10 NOTE — Telephone Encounter (Signed)
Referral has been submitted to [facility]. Patient is already established last seen 2015. Referral marked urgent.

## 2015-02-10 NOTE — Telephone Encounter (Signed)
Patient notified

## 2015-02-13 HISTORY — PX: MEDIAL PARTIAL KNEE REPLACEMENT: SHX5965

## 2015-03-08 ENCOUNTER — Ambulatory Visit: Payer: Medicaid Other | Admitting: Family Medicine

## 2015-03-13 ENCOUNTER — Other Ambulatory Visit: Payer: Self-pay | Admitting: Family Medicine

## 2015-03-13 ENCOUNTER — Ambulatory Visit (INDEPENDENT_AMBULATORY_CARE_PROVIDER_SITE_OTHER): Payer: Medicaid Other | Admitting: Family Medicine

## 2015-03-13 ENCOUNTER — Encounter: Payer: Self-pay | Admitting: Family Medicine

## 2015-03-13 ENCOUNTER — Other Ambulatory Visit: Payer: Self-pay | Admitting: Podiatry

## 2015-03-13 VITALS — BP 130/81 | HR 106 | Temp 99.1°F | Ht 66.25 in | Wt 263.2 lb

## 2015-03-13 DIAGNOSIS — E1165 Type 2 diabetes mellitus with hyperglycemia: Secondary | ICD-10-CM | POA: Diagnosis not present

## 2015-03-13 DIAGNOSIS — Z86711 Personal history of pulmonary embolism: Secondary | ICD-10-CM | POA: Diagnosis not present

## 2015-03-13 DIAGNOSIS — G5622 Lesion of ulnar nerve, left upper limb: Secondary | ICD-10-CM

## 2015-03-13 DIAGNOSIS — Z720 Tobacco use: Secondary | ICD-10-CM

## 2015-03-13 MED ORDER — VARENICLINE TARTRATE 0.5 MG X 11 & 1 MG X 42 PO MISC
ORAL | Status: DC
Start: 2015-03-13 — End: 2015-10-12

## 2015-03-13 NOTE — Progress Notes (Signed)
BP 130/81 mmHg  Pulse 106  Temp(Src) 99.1 F (37.3 C)  Ht 5' 6.25" (1.683 m)  Wt 263 lb 3.2 oz (119.387 kg)  BMI 42.15 kg/m2  SpO2 97%  LMP 05/19/2013   Subjective:    Patient ID: [name] Golden, female    DOB: 1975-02-28, 40 y.o.   MRN: [id number]  HPI: [name] Golden is a 40 y.o. female  Chief Complaint  Patient presents with  . Follow-up  . Post Op F/U    Patient states surgery went well.   . Numbness    On left hand, pinky to middle fingers stay numb.   . Weight Loss    Weight Loss Surgery  . Medication Management    Wants to talk about Chantix   She had partial right knee replacement with meniscectomy Feb 13, 2015 Dr. Leanor Kail She is going to start outpatient PT tomorrow; was hospitalized for just one day; no fevers, no trouble breathing Doing lovenox injections, she knows what to look for; she has walker today; ROM is up to 111 degrees; she is being really careful to not overdue it  She has been checking her sugars (diabetes); 221, 150, 131, 154, 106, 118 prior to surgery  She called the bariatric surgery; thinking about June or July, Medicaid has approved it through [facility]  Her fingers are not cold, can move them just fine; it's the 4th and 5th fingers on the left; feels like sitting [place] style; going on at least two months; told Dr. Milinda Pointer about it; the neuropathy in the feet is pretty bad, he said; he increased the gabapentin  She is struggling so hard with smoking; does live with smoker; willing to try chantix  Relevant past medical, surgical, family and social history reviewed and updated as indicated  Past Medical History  Diagnosis Date  . Anxiety   . Depression   . Osteoporosis   . Vitamin D deficiency   . Bipolar 1 disorder ([place])   . Heavy periods   . Hypertension   . History of DVT (deep vein thrombosis) 2004    leg, neck, arm  . History of pulmonary embolus (PE) 2004  . Endometriosis   . Painful intercourse   .  Painful menstrual periods   . High cholesterol   . GERD (gastroesophageal reflux disease)   . History of ovarian cyst   . Increased BMI (body mass index)   . Menopausal symptom   . Heart murmur     as child  . Sleep apnea     CPAP  . Diabetes ([place])     diet controlled  . Headache     sporadic - stress/allergies  . Arthritis     right foot and knee    Past Surgical History  Procedure Laterality Date  . Breast surgery    . Appendectomy    . Abdominal hysterectomy    . Laparoscopic adhesions removal for endometriosis    . Colonoscopy with propofol N/A 11/17/2014    Procedure: COLONOSCOPY WITH PROPOFOL;  Surgeon: Lucilla Lame, MD;  Location: [facility];  Service: Endoscopy;  Laterality: N/A;  CPAP Diabetic - diet controlled  . Medial partial knee replacement  02/13/2015    Dr. Jefm Bryant  She had a partial right knee replacement and meniscectomy February 13, 2015 Dr. Leanor Kail   Interim medical history since our last visit reviewed. Allergies and medications reviewed and updated.  Review of Systems Per HPI unless specifically indicated above  Objective:    BP 130/81 mmHg  Pulse 106  Temp(Src) 99.1 F (37.3 C)  Ht 5' 6.25" (1.683 m)  Wt 263 lb 3.2 oz (119.387 kg)  BMI 42.15 kg/m2  SpO2 97%  LMP 05/19/2013  Wt Readings from Last 3 Encounters:  03/13/15 263 lb 3.2 oz (119.387 kg)  01/23/15 273 lb (123.832 kg)  01/19/15 272 lb (123.378 kg)    Physical Exam  Constitutional: She appears well-developed and well-nourished. No distress.  Weight loss 9+ pounds in last 6 weeks  Eyes: EOM are normal. No scleral icterus.  Neck: No JVD present.  Cardiovascular: Normal rate and regular rhythm.   Pulmonary/Chest: Effort normal and breath sounds normal.  Abdominal: She exhibits no distension.  Musculoskeletal: She exhibits no edema.       Right knee: She exhibits decreased range of motion. She exhibits no swelling and no erythema.  Neurological: She is alert.   Skin: Skin is warm and dry. No pallor.  Psychiatric: She has a normal mood and affect.   Results for orders placed or performed during the hospital encounter of 01/23/15  CBC  Result Value Ref Range   WBC 9.6 3.6 - 11.0 K/uL   RBC 4.99 3.80 - 5.20 MIL/uL   Hemoglobin 12.4 12.0 - 16.0 g/dL   HCT 38.1 35.0 - 47.0 %   MCV 76.3 (L) 80.0 - 100.0 fL   MCH 24.8 (L) 26.0 - 34.0 pg   MCHC 32.6 32.0 - 36.0 g/dL   RDW 15.5 (H) 11.5 - 14.5 %   Platelets 299 150 - 440 K/uL  Comprehensive metabolic panel  Result Value Ref Range   Sodium 136 135 - 145 mmol/L   Potassium 4.0 3.5 - 5.1 mmol/L   Chloride 106 101 - 111 mmol/L   CO2 25 22 - 32 mmol/L   Glucose, Bld 131 (H) 65 - 99 mg/dL   BUN 9 6 - 20 mg/dL   Creatinine, Ser 0.83 0.44 - 1.00 mg/dL   Calcium 9.5 8.9 - 10.3 mg/dL   Total Protein 7.7 6.5 - 8.1 g/dL   Albumin 4.2 3.5 - 5.0 g/dL   AST 16 15 - 41 U/L   ALT 14 14 - 54 U/L   Alkaline Phosphatase 60 38 - 126 U/L   Total Bilirubin 0.7 0.3 - 1.2 mg/dL   GFR calc non Af Amer >60 >60 mL/min   GFR calc Af Amer >60 >60 mL/min   Anion gap 5 5 - 15      Assessment & Plan:   Problem List Items Addressed This Visit      Nervous and Auditory   Ulnar neuropathy of left upper extremity - Primary   Relevant Medications   QUEtiapine (SEROQUEL) 400 MG tablet   Oxcarbazepine (TRILEPTAL) 300 MG tablet   clonazePAM (KLONOPIN) 1 MG tablet   varenicline (CHANTIX STARTING MONTH PAK) 0.5 MG X 11 & 1 MG X 42 tablet   Other Relevant Orders   Nerve conduction test     Other   Morbid obesity ([place])    Agree with pursuing bariatric surgery; significant weight loss would help several chronic medical conditions; will be glad to see her monthly if needed per [facility] team      History of pulmonary embolus (PE)    Hx of right lower extremity DVT and bilateral PE in 2004; she is on anticoagulation now post-operatively; no signs of DVT today on exam      Tobacco abuse    Smoking  cessation  counseling done; she wishes to try chantix; encouragement and cessation numbers for support given; if any psych sx, stop and seek help right away      Relevant Medications   varenicline (CHANTIX STARTING MONTH PAK) 0.5 MG X 11 & 1 MG X 42 tablet      Follow up plan: Return in about 3 months (around 06/10/2015) for thirty minute follow-up with fasting labs.  An after-visit summary was printed and given to the patient at [facility].  Please see the patient instructions which may contain other information and recommendations beyond what is mentioned above in the assessment and plan.  Meds ordered this encounter  Medications  . QUEtiapine (SEROQUEL) 400 MG tablet    Sig: Take 400 mg by mouth at bedtime.  . Oxcarbazepine (TRILEPTAL) 300 MG tablet    Sig: Take 300 tablets by mouth 2 (two) times daily.  . clonazePAM (KLONOPIN) 1 MG tablet    Sig: 1 tablet 2 (two) times daily. Take 1 tablet by mouth twice a day for anxiety  . varenicline (CHANTIX STARTING MONTH PAK) 0.5 MG X 11 & 1 MG X 42 tablet    Sig: Take one 0.5 mg tablet by mouth once daily for 3 days, then increase to one 0.5 mg tablet twice daily for 4 days, then increase to one 1 mg tablet twice daily.    Dispense:  53 tablet    Refill:  0  Note: the seroquel, trileptal, and clonazepam were not ordered by me; just updated by CMA in med list; I did prescribe the varenicline  Orders Placed This Encounter  Procedures  . Nerve conduction test   Face-to-face time with patient was more than 25 minutes, >50% time spent counseling and coordination of care

## 2015-03-13 NOTE — Patient Instructions (Addendum)
I do encourage you to quit smoking Call [phone] to sign up for smoking cessation classes You can call 1-800-QUIT-NOW to talk with a smoking cessation coach  Do start Chantix starter kit and then stay on a continuing pack for two more months Common side effects including but not limited to nausea/vomiting, vivid dreams, behaviour changes, depression, suicidal ideation; also explained possible increased cardiovascular risk Keep your mental health professionals involved in any changes in your mood  We'll schedule the testing for the left hand

## 2015-03-16 ENCOUNTER — Telehealth: Payer: Self-pay | Admitting: Family Medicine

## 2015-03-16 NOTE — Telephone Encounter (Signed)
I authorized the Chantix and vitamin D already If pharmacy did not get them, okay to call in The vit D is correct as written Thank you

## 2015-03-16 NOTE — Telephone Encounter (Signed)
Pt needs to have chantix and vitamin d sent to [facility]. Pharmacy also needs clarification on vitamin d.

## 2015-03-16 NOTE — Telephone Encounter (Signed)
Routing to provider  

## 2015-03-17 NOTE — Telephone Encounter (Signed)
Called pharmacy. They said they did receive the rx for the vitamin d but did not get the one for chantix. So I called the Chatix into the pharmacy.

## 2015-03-22 ENCOUNTER — Ambulatory Visit: Payer: Medicaid Other | Admitting: Podiatry

## 2015-03-25 DIAGNOSIS — Z720 Tobacco use: Secondary | ICD-10-CM | POA: Insufficient documentation

## 2015-03-25 DIAGNOSIS — G5622 Lesion of ulnar nerve, left upper limb: Secondary | ICD-10-CM | POA: Insufficient documentation

## 2015-03-25 NOTE — Assessment & Plan Note (Signed)
Agree with pursuing bariatric surgery; significant weight loss would help several chronic medical conditions; will be glad to see her monthly if needed per [facility] team

## 2015-03-25 NOTE — Assessment & Plan Note (Signed)
Reviewed recent FSBS readings; most between 100-150, outlier of 221; praised weight loss; hopefully, significant weight loss following bariatric surgery will assist with glucose control

## 2015-03-25 NOTE — Assessment & Plan Note (Signed)
Smoking cessation counseling done; she wishes to try chantix; encouragement and cessation numbers for support given; if any psych sx, stop and seek help right away

## 2015-03-25 NOTE — Assessment & Plan Note (Signed)
Hx of right lower extremity DVT and bilateral PE in 2004; she is on anticoagulation now post-operatively; no signs of DVT today on exam

## 2015-04-11 ENCOUNTER — Telehealth: Payer: Self-pay | Admitting: Family Medicine

## 2015-04-11 NOTE — Telephone Encounter (Signed)
[facility]  [phone] I spoke to staff member there to see if anything else I could do on my end to help her with bariatric surgeon Wait list for seminar in June She says there is nothing else we need to be doing from the PCP end of things

## 2015-05-03 ENCOUNTER — Telehealth: Payer: Self-pay | Admitting: Family Medicine

## 2015-05-03 DIAGNOSIS — L732 Hidradenitis suppurativa: Secondary | ICD-10-CM

## 2015-05-03 NOTE — Telephone Encounter (Signed)
Routed to Dr. Sanda Klein for referral approval

## 2015-05-03 NOTE — Telephone Encounter (Signed)
Patient has appointment scheduled however she is requesting a referral to be place to Dr Otho Ket [facility] campus (dermatologist) which the referral on there file has expired. Also requesting referral to see someone in the [facility] area for her hernia to repair it. Requesting not to see Dr Durwin Reges at [facility].

## 2015-05-04 NOTE — Telephone Encounter (Signed)
So I entered the dermatology referral, no problem there I reveiwed the last CT scan and Dr. Reginal Lutes note and there does not appear to actually be a hernia there to fix Here's his assessment and plan: 1. Chronic abdominal pain 40 year old female with chronic abdominal pain. No evidence of hernia on CT scan. Discussed with patient that her abdominal pain with its relation to food warrants a gastroenterological workup. She has previously been seen by Dr. Allen Norris and is interested in returning to his care for this continued workup. Discussed that her alternating constipation and diarrhea could also be investigated same time with Dr. Allen Norris. Patient voiced understanding that if no other cause could be identified that her abdominal pain might be weight-related. She voiced an interest in weight loss surgery and would be willing to have further evaluation after completing treatment with GYN and GI. No general surgical needs at this time. ------------------------------------- I know she says she doesn't want to see Dr. Allen Norris; does she want to see another GI specialist in [facility]? I don't think a surgeon is the right avenue

## 2015-05-04 NOTE — Assessment & Plan Note (Signed)
Refer to derm  ?

## 2015-05-05 NOTE — Telephone Encounter (Signed)
Patient notified will discuss more at up coming appointment.

## 2015-05-08 ENCOUNTER — Other Ambulatory Visit: Payer: Self-pay

## 2015-05-08 ENCOUNTER — Ambulatory Visit: Payer: Medicaid Other | Admitting: Family Medicine

## 2015-05-08 MED ORDER — METFORMIN HCL ER 500 MG PO TB24: 1000.0000 mg | ORAL_TABLET | Freq: Every day | ORAL | Status: DC

## 2015-05-08 NOTE — Telephone Encounter (Signed)
Routing to provider. Patient following you to [facility].

## 2015-05-08 NOTE — Telephone Encounter (Signed)
Last CMP reviewed Rx approved 

## 2015-05-15 ENCOUNTER — Ambulatory Visit (INDEPENDENT_AMBULATORY_CARE_PROVIDER_SITE_OTHER): Payer: Medicaid Other | Admitting: Family Medicine

## 2015-05-15 ENCOUNTER — Encounter: Payer: Self-pay | Admitting: Family Medicine

## 2015-05-15 VITALS — BP 126/88 | HR 102 | Temp 98.6°F | Resp 18 | Ht 66.0 in | Wt 275.2 lb

## 2015-05-15 DIAGNOSIS — G8918 Other acute postprocedural pain: Secondary | ICD-10-CM | POA: Diagnosis not present

## 2015-05-15 DIAGNOSIS — Z720 Tobacco use: Secondary | ICD-10-CM | POA: Diagnosis not present

## 2015-05-15 DIAGNOSIS — I10 Essential (primary) hypertension: Secondary | ICD-10-CM

## 2015-05-15 DIAGNOSIS — E1165 Type 2 diabetes mellitus with hyperglycemia: Secondary | ICD-10-CM | POA: Diagnosis not present

## 2015-05-15 DIAGNOSIS — F3131 Bipolar disorder, current episode depressed, mild: Secondary | ICD-10-CM | POA: Diagnosis not present

## 2015-05-15 DIAGNOSIS — M25561 Pain in right knee: Secondary | ICD-10-CM

## 2015-05-15 NOTE — Assessment & Plan Note (Signed)
Refer back to Dr. Leanor Kail

## 2015-05-15 NOTE — Progress Notes (Signed)
BP 126/88 mmHg  Pulse 102  Temp(Src) 98.6 F (37 C) (Oral)  Resp 18  Ht 5\' 6"  (1.676 m)  Wt 275 lb 3.2 oz (124.83 kg)  BMI 44.44 kg/m2  SpO2 99%  LMP 05/19/2013  Recheck heart rate:   Subjective:    Patient ID: [name] Golden, female    DOB: 10/25/1975, 40 y.o.   MRN: [id number]  HPI: [name] Golden is a 40 y.o. female  Chief Complaint  Patient presents with  . Follow-up    Coming up Bariatric Surgery  . Weight Gain    Patient states she has been watching her diet, and walking more, but with her right knee surgery her mobility has been bothering her. Patient has had a lot of arthritis.   She was hospitalized March 25th through March 28th at [facility] for anxiety d/o, bipolar 1 d/o Vitals there 136/68, heart rate 104 She is doing better now  She is having more clicking and pain in the right knee, site of previous surgery Would like to go back to Dr. Leanor Kail  Looking forward to weight loss surgery; she has been struggling with her weight, and with her knee injury and subsequent surgery and now pain again, her activity level has really been limited; she has gained weight since our last visit; she is waiting to hear from the surgeon's office, on waiting list I believe  Relevant past medical, surgical, family and social history reviewed and updated as indicated. Past Medical History  Diagnosis Date  . Anxiety   . Depression   . Osteoporosis   . Vitamin D deficiency   . Bipolar 1 disorder ([place])   . Hypertension   . History of DVT (deep vein thrombosis) 2004    leg, neck, arm  . History of pulmonary embolus (PE) 2004  . Endometriosis   . Painful intercourse   . High cholesterol   . GERD (gastroesophageal reflux disease)   . History of ovarian cyst   . Increased BMI (body mass index)   . Menopausal symptom   . Heart murmur     as child  . Sleep apnea     CPAP  . Diabetes ([place])     diet controlled  . Headache     sporadic - stress/allergies  .  Arthritis     right foot and knee  . Bipolar 1 disorder, depressed, mild ([place]) 06/03/2015   Past Surgical History  Procedure Laterality Date  . Breast surgery    . Appendectomy    . Abdominal hysterectomy    . Laparoscopic adhesions removal for endometriosis    . Colonoscopy with propofol N/A 11/17/2014    Procedure: COLONOSCOPY WITH PROPOFOL;  Surgeon: Lucilla Lame, MD;  Location: [facility];  Service: Endoscopy;  Laterality: N/A;  CPAP Diabetic - diet controlled  . Medial partial knee replacement  02/13/2015    Dr. Jefm Bryant   Family History  Problem Relation Age of Onset  . Diabetes Mother   . Hypertension Mother   . Cancer Mother     cervical  . Hypertension Father   . Cancer Maternal Aunt     pancreatic  . Diabetes Paternal Aunt   . Cancer Paternal Aunt     breast  . Heart disease Paternal Uncle     hole in heart  . Hypertension Maternal Grandmother   . Cancer Maternal Grandfather     lung  . Hypertension Maternal Grandfather   . Heart disease Paternal Grandmother   .  Thyroid disease Paternal Grandmother   . Hypertension Paternal Grandmother   . Diabetes Paternal Grandfather   . Stroke Paternal Grandfather   . Hypertension Paternal Grandfather    Social History  Substance Use Topics  . Smoking status: Current Some Day Smoker -- 0.50 packs/day for 23 years    Types: Cigarettes    Last Attempt to Quit: 12/03/2014  . Smokeless tobacco: Never Used     Comment: off and on smoker since age 18  . Alcohol Use: 0.0 oz/week    0 Standard drinks or equivalent per week     Comment: Occasional   Interim medical history since our last visit reviewed. Allergies and medications reviewed and updated.  Review of Systems Per HPI unless specifically indicated above     Objective:    BP 126/88 mmHg  Pulse 102  Temp(Src) 98.6 F (37 C) (Oral)  Resp 18  Ht 5\' 6"  (1.676 m)  Wt 275 lb 3.2 oz (124.83 kg)  BMI 44.44 kg/m2  SpO2 99%  LMP 05/19/2013  Wt Readings  from Last 3 Encounters:  05/15/15 275 lb 3.2 oz (124.83 kg)  03/13/15 263 lb 3.2 oz (119.387 kg)  01/23/15 273 lb (123.832 kg)    Today's Vitals   05/15/15 1117 05/15/15 1119 05/15/15 1125  BP: 126/88    Pulse: 112  102  Temp: 98.6 F (37 C)    TempSrc: Oral    Resp: 18    Height: 5\' 6"  (1.676 m)    Weight: 275 lb 3.2 oz (124.83 kg)    SpO2: 99%    PainSc:  7     Physical Exam  Constitutional: She appears well-developed and well-nourished. No distress.  Weight gain 12 pounds in last 2 months  Eyes: EOM are normal. No scleral icterus.  Neck: No JVD present.  Cardiovascular: Normal rate and regular rhythm.   Pulmonary/Chest: Effort normal and breath sounds normal.  Abdominal: She exhibits no distension.  Musculoskeletal: She exhibits no edema.       Right knee: She exhibits decreased range of motion. She exhibits no swelling and no erythema. Tenderness found.  There is a palpable and audicle click with extension of the right knee  Neurological: She is alert.  Skin: Skin is warm and dry. No pallor.  Psychiatric: She has a normal mood and affect.   Diabetic Foot Form - Detailed   Diabetic Foot Exam - detailed  Diabetic Foot exam was performed with the following findings:  Yes 05/15/2015  7:41 PM  Visual Foot Exam completed.:  Yes  Are the toenails long?:  No  Are the toenails thick?:  No  Are the toenails ingrown?:  No    Pulse Foot Exam completed.:  Yes  Right Dorsalis Pedis:  Present Left Dorsalis Pedis:  Present  Sensory Foot Exam Completed.:  Yes  Swelling:  No  Semmes-Weinstein Monofilament Test  R Site 1-Great Toe:  Pos L Site 1-Great Toe:  Pos  R Site 4:  Pos L Site 4:  Pos  R Site 5:  Pos L Site 5:  Pos        Results for orders placed or performed during the hospital encounter of 01/23/15  CBC  Result Value Ref Range   WBC 9.6 3.6 - 11.0 K/uL   RBC 4.99 3.80 - 5.20 MIL/uL   Hemoglobin 12.4 12.0 - 16.0 g/dL   HCT 38.1 35.0 - 47.0 %   MCV 76.3 (L) 80.0 -  100.0 fL   MCH  24.8 (L) 26.0 - 34.0 pg   MCHC 32.6 32.0 - 36.0 g/dL   RDW 15.5 (H) 11.5 - 14.5 %   Platelets 299 150 - 440 K/uL  Comprehensive metabolic panel  Result Value Ref Range   Sodium 136 135 - 145 mmol/L   Potassium 4.0 3.5 - 5.1 mmol/L   Chloride 106 101 - 111 mmol/L   CO2 25 22 - 32 mmol/L   Glucose, Bld 131 (H) 65 - 99 mg/dL   BUN 9 6 - 20 mg/dL   Creatinine, Ser 0.83 0.44 - 1.00 mg/dL   Calcium 9.5 8.9 - 10.3 mg/dL   Total Protein 7.7 6.5 - 8.1 g/dL   Albumin 4.2 3.5 - 5.0 g/dL   AST 16 15 - 41 U/L   ALT 14 14 - 54 U/L   Alkaline Phosphatase 60 38 - 126 U/L   Total Bilirubin 0.7 0.3 - 1.2 mg/dL   GFR calc non Af Amer >60 >60 mL/min   GFR calc Af Amer >60 >60 mL/min   Anion gap 5 5 - 15      Assessment & Plan:   Problem List Items Addressed This Visit      Cardiovascular and Mediastinum   Essential hypertension, benign    Well-controlled today        Endocrine   Diabetes mellitus type 2, uncontrolled ([place]) - Primary    Sugars in hospital 100-200 range per patient; foot exam by MD today        Other   Bipolar 1 disorder, depressed, mild ([place])    Improved after hospitalization; continue to f/u with psychiatrist      Morbid obesity ([place])    Encouragement given; I believe she will be a good candidate for bariatric surgery; we'll check a TSH once we get closer toward surgery, as I know they will expect that to be checked; it has been checked several times in the past      Postoperative pain of right knee    Refer back to Dr. Leanor Kail      Relevant Orders   Ambulatory referral to Orthopedic Surgery   Tobacco abuse    Encouragement given to quit         Follow up plan: No Follow-up on file.  An after-visit summary was printed and given to the patient at [facility].  Please see the patient instructions which may contain other information and recommendations beyond what is mentioned above in the assessment and plan.  Orders Placed This  Encounter  Procedures  . Ambulatory referral to Orthopedic Surgery   Face-to-face time with patient was more than 25 minutes, >50% time spent counseling and coordination of care

## 2015-05-15 NOTE — Assessment & Plan Note (Signed)
Sugars in hospital 100-200 range per patient; foot exam by MD today

## 2015-05-15 NOTE — Assessment & Plan Note (Signed)
Well controlled today.

## 2015-05-15 NOTE — Patient Instructions (Addendum)
We'll refer you to Dr. Jefm Bryant and the surgeon at [facility] I am here for you if needed Return at the end of June

## 2015-05-18 ENCOUNTER — Ambulatory Visit (INDEPENDENT_AMBULATORY_CARE_PROVIDER_SITE_OTHER): Payer: Medicaid Other | Admitting: Podiatry

## 2015-05-18 DIAGNOSIS — B351 Tinea unguium: Secondary | ICD-10-CM | POA: Diagnosis not present

## 2015-05-18 DIAGNOSIS — M79676 Pain in unspecified toe(s): Secondary | ICD-10-CM | POA: Diagnosis not present

## 2015-05-18 DIAGNOSIS — E1142 Type 2 diabetes mellitus with diabetic polyneuropathy: Secondary | ICD-10-CM

## 2015-05-18 MED ORDER — CICLOPIROX 8 % EX SOLN: Freq: Every day | CUTANEOUS | Status: DC

## 2015-05-18 NOTE — Progress Notes (Signed)
Patient ID: [name] Golden, female   DOB: 08/20/1975, 40 y.o.   MRN: [id number]  Subjective: 40 y.o. returns the office today for painful, elongated, thickened toenails which she cannot trim herself. She states that she gets ingrown toenails but denies any redness or swelling or drainage. She is inquiring about toenail fungus treatments as well. Denies any redness or drainage around the nails. Denies any acute changes since last appointment and no new complaints today. Denies any systemic complaints such as fevers, chills, nausea, vomiting.   Objective: AAO 3, NAD DP/PT pulses palpable, CRT less than 3 seconds Nails hypertrophic, dystrophic, elongated, brittle, discolored 10. Thre is mild incurvation of the nails. There is tenderness overlying the nails 1-5 bilaterally. There is no surrounding erythema or drainage along the nail sites. No open lesions or pre-ulcerative lesions are identified. No other areas of tenderness bilateral lower extremities. No overlying edema, erythema, increased warmth. No pain with calf compression, swelling, warmth, erythema.  Assessment: Patient presents with symptomatic onychomycosis, ingrown toenail  Plan: -Treatment options including alternatives, risks, complications were discussed -Discussed partial nail avulsions but she wishes to hold off at this time. Nails sharply debrided 10 without complication/bleeding. -Discussed treatment options for onychomycosis. She is elected to proceed with topical treatment. Discussed multiple options and she will proceed with [place]. Instructions were provided. -Discussed daily foot inspection. If there are any changes, to call the office immediately.  -Follow-up in 3 months or sooner if any problems are to arise. In the meantime, encouraged to call the office with any questions, concerns, changes symptoms.  Celesta Gentile, DPM

## 2015-05-30 ENCOUNTER — Ambulatory Visit: Payer: Medicaid Other | Admitting: Physical Therapy

## 2015-06-03 ENCOUNTER — Encounter: Payer: Self-pay | Admitting: Family Medicine

## 2015-06-03 DIAGNOSIS — F3131 Bipolar disorder, current episode depressed, mild: Secondary | ICD-10-CM

## 2015-06-03 HISTORY — DX: Bipolar disorder, current episode depressed, mild: F31.31

## 2015-06-03 NOTE — Assessment & Plan Note (Signed)
Encouragement given to quit

## 2015-06-03 NOTE — Assessment & Plan Note (Signed)
Encouragement given; I believe she will be a good candidate for bariatric surgery; we'll check a TSH once we get closer toward surgery, as I know they will expect that to be checked; it has been checked several times in the past

## 2015-06-03 NOTE — Assessment & Plan Note (Signed)
Improved after hospitalization; continue to f/u with psychiatrist

## 2015-06-05 ENCOUNTER — Ambulatory Visit: Payer: Medicaid Other | Attending: Unknown Physician Specialty | Admitting: Physical Therapy

## 2015-06-05 DIAGNOSIS — M25561 Pain in right knee: Secondary | ICD-10-CM | POA: Diagnosis present

## 2015-06-05 DIAGNOSIS — M25661 Stiffness of right knee, not elsewhere classified: Secondary | ICD-10-CM | POA: Diagnosis present

## 2015-06-05 DIAGNOSIS — R262 Difficulty in walking, not elsewhere classified: Secondary | ICD-10-CM | POA: Diagnosis present

## 2015-06-05 DIAGNOSIS — M6281 Muscle weakness (generalized): Secondary | ICD-10-CM | POA: Diagnosis present

## 2015-06-06 ENCOUNTER — Encounter: Payer: Self-pay | Admitting: Physical Therapy

## 2015-06-06 NOTE — Therapy (Signed)
[facility] [address] [place], [place], [address] Phone: [phone]   Fax:  [phone]  Physical Therapy Evaluation  Patient Details  Name: [name] Golden MRN: [id number] Date of Birth: 1975-04-13 Referring Provider: Jones Bales.   Encounter Date: 06/05/2015      PT End of Session - 06/06/15 1737    Visit Number 1   Number of Visits 8   Date for PT Re-Evaluation 07/03/15   PT Start Time 1101   PT Stop Time 1202   PT Time Calculation (min) 61 min   Activity Tolerance Patient tolerated treatment well;Patient limited by pain   Behavior During Therapy [facility] for tasks assessed/performed      Past Medical History  Diagnosis Date  . Anxiety   . Depression   . Osteoporosis   . Vitamin D deficiency   . Bipolar 1 disorder ([place])   . Hypertension   . History of DVT (deep vein thrombosis) 2004    leg, neck, arm  . History of pulmonary embolus (PE) 2004  . Endometriosis   . Painful intercourse   . High cholesterol   . GERD (gastroesophageal reflux disease)   . History of ovarian cyst   . Increased BMI (body mass index)   . Menopausal symptom   . Heart murmur     as child  . Sleep apnea     CPAP  . Diabetes ([place])     diet controlled  . Headache     sporadic - stress/allergies  . Arthritis     right foot and knee  . Bipolar 1 disorder, depressed, mild ([place]) 06/03/2015    Past Surgical History  Procedure Laterality Date  . Breast surgery    . Appendectomy    . Abdominal hysterectomy    . Laparoscopic adhesions removal for endometriosis    . Colonoscopy with propofol N/A 11/17/2014    Procedure: COLONOSCOPY WITH PROPOFOL;  Surgeon: Lucilla Lame, MD;  Location: [place];  Service: Endoscopy;  Laterality: N/A;  CPAP Diabetic - diet controlled  . Medial partial knee replacement  02/13/2015    Dr. Jefm Bryant    There were no vitals filed for this visit.       Subjective Assessment - 06/06/15  1730    Subjective Pt. s/p R partial knee replacement on 02/13/15.  Pt. reports persistent pain and issues with R knee since surgery.  Pt. states she may require another arthroscopic surgery to clean patella.  Pt. currently not working due to knee.    Limitations Lifting;Standing;Walking   Currently in Pain? Yes   Pain Score 7    Pain Location Knee   Pain Orientation Right   Pain Descriptors / Indicators Aching   Pain Type Surgical pain;Chronic pain            [facility] PT Assessment - 06/06/15 0001    Assessment   Medical Diagnosis s/p R unicompartmental knee replacement   Referring Provider H.B. Leeanne Mannan.    Onset Date/Surgical Date 02/13/15   Prior Therapy HHPT      Nustep L7 10 min.  Discussed HEP in depth.         PT Education - 06/06/15 1736    Education provided Yes   Education Details Discussed HEP (importance of R LE strengthening/ R knee ext.).    Person(s) Educated Patient   Methods Explanation;Demonstration;Handout   Comprehension Verbalized understanding;Returned demonstration  PT Long Term Goals - 06/06/15 1731    PT LONG TERM GOAL #1   Title Pt. will increase LEFS to >30 out of 80 to improve functional mobility.    Baseline LEFS: 11 out of 80 on 5/15   Time 4   Period Weeks   Status New   PT LONG TERM GOAL #2   Title Pt. will increase R knee extension to -2 deg. to improve gait pattern/ mobility.     Baseline  L knee AROM 0-118 deg.  R knee -7 to 116 deg.    Time 4   Period Weeks   Status New   PT LONG TERM GOAL #3   Title Pt. I with HEP to increase R LE muscle strength 1/2 muscle grade to improve pain-free standing/walking.    Baseline L LE muscle strength grossly 5/5 MMT, R LE grossly 4+/5 MMT except knee flexion/ext. 4/5 MMT.    Time 4   Period Weeks   Status New   PT LONG TERM GOAL #4   Title Pt. will ambulate with normalized gait pattern and no assistive device safely to improve community mobility.    Baseline R antalgic  gait with use of SPC   Time 4   Period Weeks   Status New            Plan - 06/06/15 1738    Clinical Impression Statement Pt. is a pleasant 40 y/o female s/p R unicompartmental knee replacement on 02/13/15.  Pt. reports persistent 7/10 R knee pain since surgery and 4/10 at best (meds.).  Pt. states she may require another R knee arthroscopic surgery but is unsure.  Moderate R knee muscle atrophy as compared to L:  R/L  joint line (46.5/49 cm), mid-calf (44/46.5 cm.), distal quad (53/58 cm.).  No swelling/ no redness/ no temp.  Crepitus noted in R knee with flexion/ext. in sitting position. L LE muscle strength grossly 5/5 MMT, R LE grossly 4+/5 MMT except knee flexion/ext. 4/5 MMT.  L knee AROM 0-118 deg.  R knee -7 to 116 deg.  Pt. ambulates with stiff, R antalgic gait pattern with use of SPC around clinic.  Pt. will benefit from skilled PT services to increase R knee ext./ strengthening to improve pain-free mobility.     Rehab Potential Good   PT Frequency 2x / week   PT Duration 4 weeks   PT Treatment/Interventions ADLs/Self Care Home Management;Aquatic Therapy;Cryotherapy;Balance training;Therapeutic exercise;Therapeutic activities;Functional mobility training;Stair training;Gait training;Neuromuscular re-education;Patient/family education;Manual techniques;Passive range of motion;Scar mobilization   PT Next Visit Plan ISSUE HEP for R LE strengthening/ knee extension   PT Home Exercise Plan Reviewed handouts   Consulted and Agree with Plan of Care Patient      Patient will benefit from skilled therapeutic intervention in order to improve the following deficits and impairments:  Abnormal gait, Decreased endurance, Hypomobility, Obesity, Decreased activity tolerance, Decreased balance, Impaired flexibility, Decreased range of motion, Decreased mobility, Difficulty walking, Pain, Decreased strength  Visit Diagnosis: Pain in right knee  Joint stiffness of knee, right  Difficulty in  walking, not elsewhere classified  Muscle weakness (generalized)     Problem List Patient Active Problem List   Diagnosis Date Noted  . Bipolar 1 disorder, depressed, mild ([place]) 06/03/2015  . Postoperative pain of right knee 05/15/2015  . Ulnar neuropathy of left upper extremity 03/25/2015  . Tobacco abuse 03/25/2015  . History of pulmonary embolus (PE) 01/24/2015  . Neoplasm of skin of axilla 01/19/2015  . Hidradenitis  suppurativa 01/19/2015  . Constipation 11/27/2014  . Vitamin D deficiency 11/27/2014  . Microcytosis 11/27/2014  . Elevated ferritin 11/27/2014  . Change in bowel habits   . Chronic abdominal pain 09/14/2014  . Posttraumatic stress disorder 09/14/2014  . Diabetes mellitus type 2, uncontrolled ([place]) 09/14/2014  . Hyperlipidemia 09/14/2014  . Morbid obesity ([place]) 09/14/2014  . Essential hypertension, benign 09/14/2014  . Acne inversa 08/26/2012   Pura Spice, PT, DPT # [phone]   06/06/2015, 5:48 PM  [place] [facility] [address] [place], [place], [address] Phone: [phone]   Fax:  [phone]  Name: Phenicia Bennette MRN: [id number] Date of Birth: 09/05/1975

## 2015-06-07 ENCOUNTER — Other Ambulatory Visit: Payer: Self-pay | Admitting: Podiatry

## 2015-06-08 ENCOUNTER — Other Ambulatory Visit: Payer: Self-pay

## 2015-06-08 NOTE — Telephone Encounter (Signed)
Pt needs an appt prior to future refills. 

## 2015-06-08 NOTE — Telephone Encounter (Signed)
Routing to provider, she is seeing you at [facility].

## 2015-06-10 MED ORDER — VITAMIN D (ERGOCALCIFEROL) 1.25 MG (50000 UNIT) PO CAPS: 50000.0000 [IU] | ORAL_CAPSULE | ORAL | Status: DC

## 2015-06-13 ENCOUNTER — Encounter: Payer: Medicaid Other | Admitting: Physical Therapy

## 2015-06-27 ENCOUNTER — Telehealth: Payer: Self-pay | Admitting: Family Medicine

## 2015-06-27 DIAGNOSIS — G8929 Other chronic pain: Secondary | ICD-10-CM

## 2015-06-27 DIAGNOSIS — R109 Unspecified abdominal pain: Principal | ICD-10-CM

## 2015-06-27 DIAGNOSIS — N83202 Unspecified ovarian cyst, left side: Secondary | ICD-10-CM

## 2015-06-27 DIAGNOSIS — N83201 Unspecified ovarian cyst, right side: Secondary | ICD-10-CM

## 2015-06-27 NOTE — Telephone Encounter (Signed)
Yes, please ask her to fast (due for lipids) I hope she is feeling better soon To urgent care, or ask her to call her GI doctor if needed

## 2015-06-27 NOTE — Telephone Encounter (Signed)
Called in with complaint about right side pain that her abdomen is a little swollen. I gave the call to [facility] to triage. Roselyn Reef spoke with patient and then asked me to schedule her for your first available. I offered the patient an appointment with Dr. Vicente Masson (he had availability for this Thursday) however she refused. Patient is scheduled with you for 07-06-15 @ 10am. She would like to know if you would like her to fast for her appointment?

## 2015-06-28 DIAGNOSIS — N83202 Unspecified ovarian cyst, left side: Secondary | ICD-10-CM

## 2015-06-28 DIAGNOSIS — N83201 Unspecified ovarian cyst, right side: Secondary | ICD-10-CM | POA: Insufficient documentation

## 2015-06-28 NOTE — Assessment & Plan Note (Signed)
Refer to GI at [facility] per patient request

## 2015-06-28 NOTE — Telephone Encounter (Signed)
Thank you; I'm sorry that I didn't realize she didn't want to see Dr. Allen Norris any more; I have just put in the referral to [facility] at [place] had another thought... If she's having abdominal pain and swelling, let's also have her see GYN; I put that referral in too (she can tell you where she'd like to go); she has had ovarian cysts, and I wonder if that's part of her pain Thanks

## 2015-06-28 NOTE — Telephone Encounter (Signed)
Pt states that she no longer wants to use Dr Allen Norris for GI that she thought we were doing a referral to GI doctor in [place]?

## 2015-07-06 ENCOUNTER — Ambulatory Visit (INDEPENDENT_AMBULATORY_CARE_PROVIDER_SITE_OTHER): Payer: Medicaid Other | Admitting: Family Medicine

## 2015-07-06 ENCOUNTER — Encounter: Payer: Self-pay | Admitting: Family Medicine

## 2015-07-06 VITALS — BP 124/80 | HR 103 | Temp 98.8°F | Resp 16 | Ht 66.0 in | Wt 272.0 lb

## 2015-07-06 DIAGNOSIS — L732 Hidradenitis suppurativa: Secondary | ICD-10-CM

## 2015-07-06 DIAGNOSIS — E559 Vitamin D deficiency, unspecified: Secondary | ICD-10-CM

## 2015-07-06 DIAGNOSIS — E1165 Type 2 diabetes mellitus with hyperglycemia: Secondary | ICD-10-CM | POA: Diagnosis not present

## 2015-07-06 DIAGNOSIS — E785 Hyperlipidemia, unspecified: Secondary | ICD-10-CM

## 2015-07-06 DIAGNOSIS — Z5181 Encounter for therapeutic drug level monitoring: Secondary | ICD-10-CM | POA: Diagnosis not present

## 2015-07-06 DIAGNOSIS — F3131 Bipolar disorder, current episode depressed, mild: Secondary | ICD-10-CM | POA: Diagnosis not present

## 2015-07-06 MED ORDER — SULFAMETHOXAZOLE-TRIMETHOPRIM 800-160 MG PO TABS
1.0000 | ORAL_TABLET | Freq: Two times a day (BID) | ORAL | Status: AC
Start: 2015-07-06 — End: 2015-07-16

## 2015-07-06 NOTE — Assessment & Plan Note (Signed)
With infection in the left axilla; start bactrim DS and refer to derm

## 2015-07-06 NOTE — Patient Instructions (Signed)
Start the Bactrim DS and take twice a day until finished Please do eat yogurt daily or take a probiotic daily for the next month or two We want to replace the healthy germs in the gut If you notice foul, watery diarrhea in the next two months, schedule an appointment RIGHT AWAY  Talk to the bariatric surgery team about abdominal hernia and if they might work on that when they do your surgery If you develop worsening abdominal pain or vomiting, go to the ER  We'll get labs today If you have not heard anything from my staff in a week about any orders/referrals/studies from today, please contact us here to follow-up ([phone]  Do keep working with your psychiatrist and new counselor

## 2015-07-06 NOTE — Progress Notes (Signed)
BP 124/80 mmHg  Pulse 103  Temp(Src) 98.8 F (37.1 C) (Oral)  Resp 16  Ht 5\' 6"  (1.676 m)  Wt 272 lb (123.378 kg)  BMI 43.92 kg/m2  SpO2 96%  LMP 05/19/2013   Subjective:    Patient ID: [name] Golden, female    DOB: 1975-03-13, 40 y.o.   MRN: [id number]  HPI: [name] Golden is a 40 y.o. female  Chief Complaint  Patient presents with  . Hypertension  . Follow-up    abnormal labs   Patient is here for follow-up; well-known to me  Feeling tired a lot lately; progressively worse; going on for a month  Hx of vitamin D deficiency; on the 50k once a month  She says she needs to see the dermatologist; big sore that drains under the left axilla; low grade fevers, sweats, foul smell  Type 2 diabetes; feeling tired, dry mouth, getting up at night to urinate occasionally; highest readings since last visit 233  High cholesterol; limiting processed meats; trying to get more fresh food; does eat eggs, less than 3 per week  Obesity; waiting list for bariatric surgery; she has lost a few pounds since last visit; she has struggled with her weight for quite some time; knee problem has limited her activity  GERD; few flare-ups; trying to eat now at least 4 hours before she lays down; avoiding processed meats  Chronic abd pain; awaiting GI referral  Bipolar 1; sees Dr. Rosine Door, will be working with new therapist; out of meds now; goes to see him today; out of medicine for a week; she did call them; no dark thoughts now; sees him today; has crisis line number  Depression screen [facility] 2/9 07/06/2015  Decreased Interest 1  Down, Depressed, Hopeless 1  PHQ - 2 Score 2  Altered sleeping 1  Tired, decreased energy 1  Change in appetite 1  Feeling bad or failure about yourself  0  Trouble concentrating 1  Moving slowly or fidgety/restless 0  Suicidal thoughts 1  PHQ-9 Score 7  Difficult doing work/chores Somewhat difficult   Relevant past medical, surgical, family and  social history reviewed Past Medical History  Diagnosis Date  . Anxiety   . Depression   . Osteoporosis   . Vitamin D deficiency   . Bipolar 1 disorder ([place])   . Hypertension   . History of DVT (deep vein thrombosis) 2004    leg, neck, arm  . History of pulmonary embolus (PE) 2004  . Endometriosis   . Painful intercourse   . High cholesterol   . GERD (gastroesophageal reflux disease)   . History of ovarian cyst   . Increased BMI (body mass index)   . Menopausal symptom   . Heart murmur     as child  . Sleep apnea     CPAP  . Diabetes ([place])     diet controlled  . Headache     sporadic - stress/allergies  . Arthritis     right foot and knee  . Bipolar 1 disorder, depressed, mild ([place]) 06/03/2015   Past Surgical History  Procedure Laterality Date  . Breast surgery    . Appendectomy    . Abdominal hysterectomy    . Laparoscopic adhesions removal for endometriosis    . Colonoscopy with propofol N/A 11/17/2014    Procedure: COLONOSCOPY WITH PROPOFOL;  Surgeon: Lucilla Lame, MD;  Location: [place];  Service: Endoscopy;  Laterality: N/A;  CPAP Diabetic - diet controlled  .  Medial partial knee replacement  02/13/2015    Dr. Jefm Bryant   Family History  Problem Relation Age of Onset  . Diabetes Mother   . Hypertension Mother   . Cancer Mother     cervical  . Hypertension Father   . Cancer Maternal Aunt     pancreatic  . Diabetes Paternal Aunt   . Cancer Paternal Aunt     breast  . Heart disease Paternal Uncle     hole in heart  . Hypertension Maternal Grandmother   . Cancer Maternal Grandfather     lung  . Hypertension Maternal Grandfather   . Heart disease Paternal Grandmother   . Thyroid disease Paternal Grandmother   . Hypertension Paternal Grandmother   . Diabetes Paternal Grandfather   . Stroke Paternal Grandfather   . Hypertension Paternal Grandfather    Social History  Substance Use Topics  . Smoking status: Current Some Day Smoker -- 0.50  packs/day for 23 years    Types: Cigarettes    Last Attempt to Quit: 12/03/2014  . Smokeless tobacco: Never Used     Comment: off and on smoker since age 76  . Alcohol Use: 0.0 oz/week    0 Standard drinks or equivalent per week     Comment: Occasional   Interim medical history since last visit reviewed. Allergies and medications reviewed  Review of Systems Per HPI unless specifically indicated above     Objective:    BP 124/80 mmHg  Pulse 103  Temp(Src) 98.8 F (37.1 C) (Oral)  Resp 16  Ht 5\' 6"  (1.676 m)  Wt 272 lb (123.378 kg)  BMI 43.92 kg/m2  SpO2 96%  LMP 05/19/2013  Wt Readings from Last 3 Encounters:  07/06/15 272 lb (123.378 kg)  05/15/15 275 lb 3.2 oz (124.83 kg)  03/13/15 263 lb 3.2 oz (119.387 kg)    Physical Exam  Constitutional: She appears well-developed and well-nourished. No distress.  Weight gain 12 pounds in last 2 months  Eyes: EOM are normal. No scleral icterus.  Neck: No JVD present.  Cardiovascular: Normal rate and regular rhythm.   Pulmonary/Chest: Effort normal and breath sounds normal.  Abdominal: She exhibits no distension.  Musculoskeletal: She exhibits no edema.       Right knee: She exhibits decreased range of motion. She exhibits no swelling.  Neurological: She is alert.  Skin: Skin is warm and dry. Lesion (left axilla, palpable erythematous lesion consistent with hidradenitis suppurativa flare) noted. No pallor.  Psychiatric: She has a normal mood and affect. Her mood appears not anxious. She does not exhibit a depressed mood.   Diabetic Foot Form - Detailed   Diabetic Foot Exam - detailed  Diabetic Foot exam was performed with the following findings:  Yes 07/06/2015 10:24 AM  Visual Foot Exam completed.:  Yes  Are the toenails long?:  No  Are the toenails thick?:  No  Are the toenails ingrown?:  No  Normal Range of Motion:  Yes    Pulse Foot Exam completed.:  Yes  Right Dorsalis Pedis:  Present Left Dorsalis Pedis:  Present    Sensory Foot Exam Completed.:  Yes  Swelling:  No  Semmes-Weinstein Monofilament Test  R Site 1-Great Toe:  Pos L Site 1-Great Toe:  Pos  R Site 4:  Pos L Site 4:  Pos  R Site 5:  Pos L Site 5:  Pos           Assessment & Plan:   Problem List  Items Addressed This Visit      Endocrine   Diabetes mellitus type 2, uncontrolled ([place])    Check A1c, foot exam by MD      Relevant Orders   Microalbumin / creatinine urine ratio   Hemoglobin A1c w Est Avg/Mean Gluc (Completed)   Lipid Panel w/Direct LDL:HDL Ratio (Completed)     Musculoskeletal and Integument   Hidradenitis suppurativa - Primary    With infection in the left axilla; start bactrim DS and refer to derm      Relevant Orders   Ambulatory referral to Dermatology     Other   Vitamin D deficiency    Check level today      Relevant Orders   VITAMIN D 25 Hydroxy (Vit-D Deficiency, Fractures)   Medication monitoring encounter   Relevant Orders   CBC with Differential/Platelet   COMPLETE METABOLIC PANEL WITH GFR   Hyperlipidemia    Check lipids today; limit saturated fats      Relevant Orders   Lipid Panel w/Direct LDL:HDL Ratio (Completed)   Bipolar 1 disorder, depressed, mild ([place])    Encouraged patient to work with her psychiatrist         Follow up plan: Return if symptoms worsen or fail to improve, for diabetes, 3 months, sooner if needed.  An after-visit summary was printed and given to the patient at [place].  Please see the patient instructions which may contain other information and recommendations beyond what is mentioned above in the assessment and plan.  Meds ordered this encounter  Medications  . sulfamethoxazole-trimethoprim (BACTRIM DS) 800-160 MG tablet    Sig: Take 1 tablet by mouth 2 (two) times daily.    Dispense:  20 tablet    Refill:  0    Orders Placed This Encounter  Procedures  . CBC with Differential/Platelet  . Microalbumin / creatinine urine ratio  . Hemoglobin A1c w  Est Avg/Mean Gluc  . COMPLETE METABOLIC PANEL WITH GFR  . Lipid Panel w/Direct LDL:HDL Ratio  . VITAMIN D 25 Hydroxy (Vit-D Deficiency, Fractures)  . COMPLETE METABOLIC PANEL WITH GFR  . Microalbumin / creatinine urine ratio  . CBC with Differential/Platelet  . Microalbumin, urine  . VITAMIN D 25 Hydroxy (Vit-D Deficiency, Fractures)  . Lipid panel  . Ambulatory referral to Dermatology

## 2015-07-06 NOTE — Assessment & Plan Note (Signed)
Check lipids today; limit saturated fats 

## 2015-07-06 NOTE — Assessment & Plan Note (Signed)
Encouraged patient to work with her psychiatrist

## 2015-07-06 NOTE — Assessment & Plan Note (Signed)
Check A1c, foot exam by MD

## 2015-07-06 NOTE — Assessment & Plan Note (Signed)
Check level today 

## 2015-07-07 LAB — MICROALBUMIN / CREATININE URINE RATIO
CREATININE, URINE: 265 mg/dL (ref 20–320)
MICROALB UR: 0.9 mg/dL
Microalb Creat Ratio: 3 mcg/mg creat (ref <30)

## 2015-07-07 LAB — VITAMIN D 25 HYDROXY (VIT D DEFICIENCY, FRACTURES): VIT D 25 HYDROXY: 23 ng/mL — AB (ref 30–100)

## 2015-07-07 LAB — COMPLETE METABOLIC PANEL WITH GFR
ALT: 9 U/L (ref 6–29)
AST: 12 U/L (ref 10–30)
Albumin: 4.2 g/dL (ref 3.6–5.1)
Alkaline Phosphatase: 60 U/L (ref 33–115)
BUN: 11 mg/dL (ref 7–25)
CALCIUM: 10 mg/dL (ref 8.6–10.2)
CHLORIDE: 103 mmol/L (ref 98–110)
CO2: 22 mmol/L (ref 20–31)
Creat: 0.72 mg/dL (ref 0.50–1.10)
GFR, Est Non African American: >89 mL/min (ref >=60)
Glucose, Bld: 161 mg/dL — ABNORMAL HIGH (ref 65–99)
POTASSIUM: 4.5 mmol/L (ref 3.5–5.3)
Sodium: 138 mmol/L (ref 135–146)
Total Bilirubin: 0.3 mg/dL (ref 0.2–1.2)
Total Protein: 7.1 g/dL (ref 6.1–8.1)

## 2015-07-07 LAB — LIPID PANEL W/DIRECT LDL/HDL RATIO
CHOLESTEROL - DIR LDL/HDL RATIO: 165 mg/dL (ref 125–200)
Direct LDL: 126 mg/dL (ref <130)
HDL: 33 mg/dL — AB (ref >=46)
LDL/HDL RATIO (DIRECT LDL): 3.8 ratio
TRIGLYCERIDES: 100 mg/dL (ref <150)
Total Chol/HDL Ratio: 5 Ratio (ref <=5.0)

## 2015-07-07 LAB — LIPID PANEL
CHOLESTEROL: 165 mg/dL (ref 125–200)
HDL: 33 mg/dL — ABNORMAL LOW (ref >=46)
LDL Cholesterol: 112 mg/dL (ref <130)
Total CHOL/HDL Ratio: 5 Ratio (ref <=5.0)
Triglycerides: 100 mg/dL (ref <150)
VLDL: 20 mg/dL (ref <30)

## 2015-07-07 LAB — CBC WITH DIFFERENTIAL/PLATELET
BASOS PCT: 0 %
Basophils Absolute: 0 cells/uL (ref 0–200)
Eosinophils Absolute: 142 cells/uL (ref 15–500)
Eosinophils Relative: 2 %
HCT: 39.2 % (ref 35.0–45.0)
Hemoglobin: 13 g/dL (ref 11.7–15.5)
LYMPHS PCT: 44 %
Lymphs Abs: 3124 cells/uL (ref 850–3900)
MCH: 25.1 pg — ABNORMAL LOW (ref 27.0–33.0)
MCHC: 33.2 g/dL (ref 32.0–36.0)
MCV: 75.8 fL — ABNORMAL LOW (ref 80.0–100.0)
MONOS PCT: 6 %
MPV: 9 fL (ref 7.5–12.5)
Monocytes Absolute: 426 cells/uL (ref 200–950)
NEUTROS ABS: 3408 {cells}/uL (ref 1500–7800)
Neutrophils Relative %: 48 %
PLATELETS: 282 10*3/uL (ref 140–400)
RBC: 5.17 MIL/uL — AB (ref 3.80–5.10)
RDW: 16.2 % — ABNORMAL HIGH (ref 11.0–15.0)
WBC: 7.1 10*3/uL (ref 3.8–10.8)

## 2015-07-07 LAB — MICROALBUMIN, URINE

## 2015-07-07 LAB — HEMOGLOBIN A1C W EST AVG/MEAN GLUC
HEMOGLOBIN A1C: 8.1 % — AB (ref <5.7)
Mean Plasma Glucose: 186 mg/dL

## 2015-07-15 ENCOUNTER — Other Ambulatory Visit: Payer: Self-pay | Admitting: Family Medicine

## 2015-07-15 MED ORDER — CANAGLIFLOZIN 100 MG PO TABS: 100.0000 mg | ORAL_TABLET | Freq: Every day | ORAL | Status: DC

## 2015-08-12 NOTE — Assessment & Plan Note (Signed)
Waiting on bariatric surgery; I am full support of this for her

## 2015-08-15 ENCOUNTER — Other Ambulatory Visit: Payer: Self-pay

## 2015-08-15 MED ORDER — HYDROCHLOROTHIAZIDE 25 MG PO TABS: 25.0000 mg | ORAL_TABLET | Freq: Every day | ORAL | 6 refills | Status: AC

## 2015-08-17 ENCOUNTER — Ambulatory Visit: Payer: Medicaid Other | Admitting: Podiatry

## 2015-08-29 ENCOUNTER — Ambulatory Visit (INDEPENDENT_AMBULATORY_CARE_PROVIDER_SITE_OTHER): Payer: Medicaid Other | Admitting: Podiatry

## 2015-08-30 NOTE — Progress Notes (Signed)
NO SHOW

## 2015-09-20 ENCOUNTER — Telehealth: Payer: Self-pay | Admitting: Family Medicine

## 2015-09-22 NOTE — Telephone Encounter (Signed)
done

## 2015-09-22 NOTE — Telephone Encounter (Signed)
Let's have her see [facility]; she already saw Dr. Adonis Huguenin (surgeon) and my recollection is that he didn't think she had a hernia to repair, so we were going to have her see GI; thank you for taking care of the derm appt too

## 2015-10-12 ENCOUNTER — Emergency Department: Payer: Medicaid Other

## 2015-10-12 ENCOUNTER — Encounter: Payer: Self-pay | Admitting: Emergency Medicine

## 2015-10-12 ENCOUNTER — Observation Stay
Admission: EM | Admit: 2015-10-12 | Discharge: 2015-10-13 | Disposition: A | Discharge status: Home / Self Care | Payer: Medicaid Other | Attending: Internal Medicine | Admitting: Internal Medicine

## 2015-10-12 ENCOUNTER — Ambulatory Visit: Payer: Medicaid Other | Admitting: Family Medicine

## 2015-10-12 DIAGNOSIS — Z833 Family history of diabetes mellitus: Secondary | ICD-10-CM | POA: Insufficient documentation

## 2015-10-12 DIAGNOSIS — J45909 Unspecified asthma, uncomplicated: Secondary | ICD-10-CM | POA: Diagnosis present

## 2015-10-12 DIAGNOSIS — E7800 Pure hypercholesterolemia, unspecified: Secondary | ICD-10-CM | POA: Insufficient documentation

## 2015-10-12 DIAGNOSIS — Z96651 Presence of right artificial knee joint: Secondary | ICD-10-CM | POA: Insufficient documentation

## 2015-10-12 DIAGNOSIS — Z6841 Body Mass Index (BMI) 40.0 and over, adult: Secondary | ICD-10-CM | POA: Insufficient documentation

## 2015-10-12 DIAGNOSIS — F3131 Bipolar disorder, current episode depressed, mild: Secondary | ICD-10-CM | POA: Insufficient documentation

## 2015-10-12 DIAGNOSIS — J441 Chronic obstructive pulmonary disease with (acute) exacerbation: Secondary | ICD-10-CM | POA: Insufficient documentation

## 2015-10-12 DIAGNOSIS — G4730 Sleep apnea, unspecified: Secondary | ICD-10-CM | POA: Diagnosis not present

## 2015-10-12 DIAGNOSIS — E119 Type 2 diabetes mellitus without complications: Secondary | ICD-10-CM | POA: Insufficient documentation

## 2015-10-12 DIAGNOSIS — E876 Hypokalemia: Secondary | ICD-10-CM | POA: Diagnosis not present

## 2015-10-12 DIAGNOSIS — J40 Bronchitis, not specified as acute or chronic: Secondary | ICD-10-CM | POA: Diagnosis present

## 2015-10-12 DIAGNOSIS — J209 Acute bronchitis, unspecified: Secondary | ICD-10-CM | POA: Insufficient documentation

## 2015-10-12 DIAGNOSIS — Z7984 Long term (current) use of oral hypoglycemic drugs: Secondary | ICD-10-CM | POA: Insufficient documentation

## 2015-10-12 DIAGNOSIS — Z880 Allergy status to penicillin: Secondary | ICD-10-CM | POA: Diagnosis not present

## 2015-10-12 DIAGNOSIS — Z8349 Family history of other endocrine, nutritional and metabolic diseases: Secondary | ICD-10-CM | POA: Insufficient documentation

## 2015-10-12 DIAGNOSIS — Z86718 Personal history of other venous thrombosis and embolism: Secondary | ICD-10-CM | POA: Insufficient documentation

## 2015-10-12 DIAGNOSIS — Z885 Allergy status to narcotic agent status: Secondary | ICD-10-CM | POA: Insufficient documentation

## 2015-10-12 DIAGNOSIS — Z823 Family history of stroke: Secondary | ICD-10-CM | POA: Insufficient documentation

## 2015-10-12 DIAGNOSIS — B179 Acute viral hepatitis, unspecified: Secondary | ICD-10-CM | POA: Insufficient documentation

## 2015-10-12 DIAGNOSIS — F319 Bipolar disorder, unspecified: Secondary | ICD-10-CM

## 2015-10-12 DIAGNOSIS — Z8249 Family history of ischemic heart disease and other diseases of the circulatory system: Secondary | ICD-10-CM | POA: Insufficient documentation

## 2015-10-12 DIAGNOSIS — E559 Vitamin D deficiency, unspecified: Secondary | ICD-10-CM | POA: Insufficient documentation

## 2015-10-12 DIAGNOSIS — E669 Obesity, unspecified: Secondary | ICD-10-CM | POA: Diagnosis not present

## 2015-10-12 DIAGNOSIS — F17210 Nicotine dependence, cigarettes, uncomplicated: Secondary | ICD-10-CM | POA: Insufficient documentation

## 2015-10-12 DIAGNOSIS — R062 Wheezing: Secondary | ICD-10-CM

## 2015-10-12 DIAGNOSIS — Z8049 Family history of malignant neoplasm of other genital organs: Secondary | ICD-10-CM | POA: Insufficient documentation

## 2015-10-12 DIAGNOSIS — Z86711 Personal history of pulmonary embolism: Secondary | ICD-10-CM | POA: Insufficient documentation

## 2015-10-12 DIAGNOSIS — M19071 Primary osteoarthritis, right ankle and foot: Secondary | ICD-10-CM | POA: Diagnosis not present

## 2015-10-12 DIAGNOSIS — I10 Essential (primary) hypertension: Secondary | ICD-10-CM | POA: Diagnosis not present

## 2015-10-12 DIAGNOSIS — Z79899 Other long term (current) drug therapy: Secondary | ICD-10-CM | POA: Diagnosis not present

## 2015-10-12 DIAGNOSIS — Z800 Family history of malignant neoplasm of digestive organs: Secondary | ICD-10-CM | POA: Insufficient documentation

## 2015-10-12 DIAGNOSIS — K219 Gastro-esophageal reflux disease without esophagitis: Secondary | ICD-10-CM | POA: Diagnosis not present

## 2015-10-12 DIAGNOSIS — Z888 Allergy status to other drugs, medicaments and biological substances status: Secondary | ICD-10-CM | POA: Insufficient documentation

## 2015-10-12 DIAGNOSIS — J440 Chronic obstructive pulmonary disease with acute lower respiratory infection: Secondary | ICD-10-CM | POA: Diagnosis not present

## 2015-10-12 DIAGNOSIS — Z91018 Allergy to other foods: Secondary | ICD-10-CM | POA: Insufficient documentation

## 2015-10-12 DIAGNOSIS — D72829 Elevated white blood cell count, unspecified: Secondary | ICD-10-CM

## 2015-10-12 LAB — CBC WITH DIFFERENTIAL/PLATELET
Basophils Absolute: 0.1 10*3/uL (ref 0–0.1)
Basophils Relative: 0 %
EOS ABS: 0.2 10*3/uL (ref 0–0.7)
EOS PCT: 2 %
HCT: 40.5 % (ref 35.0–47.0)
Hemoglobin: 13.3 g/dL (ref 12.0–16.0)
LYMPHS ABS: 5.5 10*3/uL — AB (ref 1.0–3.6)
LYMPHS PCT: 49 %
MCH: 25.1 pg — AB (ref 26.0–34.0)
MCHC: 32.8 g/dL (ref 32.0–36.0)
MCV: 76.3 fL — ABNORMAL LOW (ref 80.0–100.0)
MONOS PCT: 7 %
Monocytes Absolute: 0.7 10*3/uL (ref 0.2–0.9)
NEUTROS ABS: 4.7 10*3/uL (ref 1.4–6.5)
NEUTROS PCT: 42 %
PLATELETS: 303 10*3/uL (ref 150–440)
RBC: 5.3 MIL/uL — AB (ref 3.80–5.20)
RDW: 14.9 % — ABNORMAL HIGH (ref 11.5–14.5)
WBC: 11.2 10*3/uL — ABNORMAL HIGH (ref 3.6–11.0)

## 2015-10-12 LAB — COMPREHENSIVE METABOLIC PANEL
ALBUMIN: 4.2 g/dL (ref 3.5–5.0)
ALK PHOS: 53 U/L (ref 38–126)
ALT: 15 U/L (ref 14–54)
ANION GAP: 6 (ref 5–15)
AST: 16 U/L (ref 15–41)
BUN: 10 mg/dL (ref 6–20)
CALCIUM: 9.4 mg/dL (ref 8.9–10.3)
CHLORIDE: 105 mmol/L (ref 101–111)
CO2: 27 mmol/L (ref 22–32)
Creatinine, Ser: 0.78 mg/dL (ref 0.44–1.00)
GFR calc non Af Amer: >60 mL/min (ref >60)
GLUCOSE: 97 mg/dL (ref 65–99)
Potassium: 3.4 mmol/L — ABNORMAL LOW (ref 3.5–5.1)
SODIUM: 138 mmol/L (ref 135–145)
Total Bilirubin: 0.7 mg/dL (ref 0.3–1.2)
Total Protein: 7.9 g/dL (ref 6.5–8.1)

## 2015-10-12 LAB — GLUCOSE, CAPILLARY: GLUCOSE-CAPILLARY: 221 mg/dL — AB (ref 65–99)

## 2015-10-12 MED ORDER — QUETIAPINE FUMARATE 200 MG PO TABS
400.0000 mg | ORAL_TABLET | Freq: Every day | ORAL | Status: DC
  Administered 2015-10-12: 400 mg via ORAL
  Filled 2015-10-12: qty 2

## 2015-10-12 MED ORDER — MAGNESIUM SULFATE 2 GM/50ML IV SOLN
2.0000 g | Freq: Once | INTRAVENOUS | Status: AC
  Administered 2015-10-12: 2 g via INTRAVENOUS
  Filled 2015-10-12: qty 50

## 2015-10-12 MED ORDER — CLONAZEPAM 0.5 MG PO TABS
1.0000 mg | ORAL_TABLET | Freq: Three times a day (TID) | ORAL | Status: DC | PRN
  Administered 2015-10-12 – 2015-10-13 (×2): 1 mg via ORAL
  Filled 2015-10-12 (×2): qty 2

## 2015-10-12 MED ORDER — OXCARBAZEPINE 300 MG PO TABS
600.0000 mg | ORAL_TABLET | Freq: Two times a day (BID) | ORAL | Status: DC
  Administered 2015-10-12 – 2015-10-13 (×2): 600 mg via ORAL
  Filled 2015-10-12 (×3): qty 2

## 2015-10-12 MED ORDER — SODIUM CHLORIDE 0.9 % IV BOLUS (SEPSIS)
1000.0000 mL | Freq: Once | INTRAVENOUS | Status: AC
  Administered 2015-10-12: 1000 mL via INTRAVENOUS

## 2015-10-12 MED ORDER — METHYLPREDNISOLONE SODIUM SUCC 125 MG IJ SOLR
125.0000 mg | Freq: Once | INTRAMUSCULAR | Status: AC
Start: 2015-10-12 — End: 2015-10-12
  Administered 2015-10-12: 125 mg via INTRAVENOUS
  Filled 2015-10-12: qty 2

## 2015-10-12 MED ORDER — ONDANSETRON HCL 4 MG PO TABS
4.0000 mg | ORAL_TABLET | Freq: Four times a day (QID) | ORAL | Status: DC | PRN
Start: 2015-10-12 — End: 2015-10-13

## 2015-10-12 MED ORDER — ONDANSETRON HCL 4 MG/2ML IJ SOLN: 4.0000 mg | Freq: Four times a day (QID) | INTRAMUSCULAR | Status: DC | PRN

## 2015-10-12 MED ORDER — IPRATROPIUM-ALBUTEROL 0.5-2.5 (3) MG/3ML IN SOLN
3.0000 mL | Freq: Once | RESPIRATORY_TRACT | Status: AC
  Administered 2015-10-12: 3 mL via RESPIRATORY_TRACT
  Filled 2015-10-12: qty 3

## 2015-10-12 MED ORDER — INSULIN ASPART 100 UNIT/ML ~~LOC~~ SOLN: 0.0000 [IU] | Freq: Three times a day (TID) | SUBCUTANEOUS | Status: DC

## 2015-10-12 MED ORDER — SODIUM CHLORIDE 0.9 % IV SOLN
INTRAVENOUS | Status: DC
  Administered 2015-10-12: 18:00:00 via INTRAVENOUS

## 2015-10-12 MED ORDER — DEXTROSE 5 % IV SOLN
500.0000 mg | INTRAVENOUS | Status: DC
  Administered 2015-10-12: 500 mg via INTRAVENOUS
  Filled 2015-10-12 (×3): qty 500

## 2015-10-12 MED ORDER — POTASSIUM CHLORIDE CRYS ER 20 MEQ PO TBCR
40.0000 meq | EXTENDED_RELEASE_TABLET | Freq: Once | ORAL | Status: AC
  Administered 2015-10-12: 40 meq via ORAL
  Filled 2015-10-12: qty 2

## 2015-10-12 MED ORDER — IPRATROPIUM-ALBUTEROL 0.5-2.5 (3) MG/3ML IN SOLN
3.0000 mL | RESPIRATORY_TRACT | Status: DC
  Administered 2015-10-12 – 2015-10-13 (×2): 3 mL via RESPIRATORY_TRACT
  Filled 2015-10-12 (×3): qty 3

## 2015-10-12 MED ORDER — GABAPENTIN 300 MG PO CAPS: 300.0000 mg | ORAL_CAPSULE | Freq: Every day | ORAL | Status: DC

## 2015-10-12 MED ORDER — ENOXAPARIN SODIUM 40 MG/0.4ML ~~LOC~~ SOLN
40.0000 mg | Freq: Two times a day (BID) | SUBCUTANEOUS | Status: DC
  Administered 2015-10-12 – 2015-10-13 (×2): 40 mg via SUBCUTANEOUS
  Filled 2015-10-12 (×2): qty 0.4

## 2015-10-12 MED ORDER — METHYLPREDNISOLONE SODIUM SUCC 125 MG IJ SOLR: 60.0000 mg | INTRAMUSCULAR | Status: DC

## 2015-10-12 MED ORDER — SENNOSIDES-DOCUSATE SODIUM 8.6-50 MG PO TABS: 1.0000 | ORAL_TABLET | Freq: Every evening | ORAL | Status: DC | PRN

## 2015-10-12 MED ORDER — ACETAMINOPHEN 325 MG PO TABS: 650.0000 mg | ORAL_TABLET | Freq: Four times a day (QID) | ORAL | Status: DC | PRN

## 2015-10-12 MED ORDER — ACETAMINOPHEN 650 MG RE SUPP: 650.0000 mg | Freq: Four times a day (QID) | RECTAL | Status: DC | PRN

## 2015-10-12 MED ORDER — ATORVASTATIN CALCIUM 20 MG PO TABS
40.0000 mg | ORAL_TABLET | Freq: Every day | ORAL | Status: DC
  Administered 2015-10-12: 40 mg via ORAL
  Filled 2015-10-12: qty 2

## 2015-10-12 MED ORDER — INSULIN ASPART 100 UNIT/ML ~~LOC~~ SOLN
0.0000 [IU] | Freq: Every day | SUBCUTANEOUS | Status: DC
  Administered 2015-10-12: 2 [IU] via SUBCUTANEOUS
  Filled 2015-10-12: qty 2

## 2015-10-12 MED ORDER — VITAMIN D (ERGOCALCIFEROL) 1.25 MG (50000 UNIT) PO CAPS
50000.0000 [IU] | ORAL_CAPSULE | ORAL | Status: DC
  Administered 2015-10-12: 50000 [IU] via ORAL
  Filled 2015-10-12: qty 1

## 2015-10-12 MED ORDER — HYDROCHLOROTHIAZIDE 25 MG PO TABS
25.0000 mg | ORAL_TABLET | Freq: Every day | ORAL | Status: DC
  Administered 2015-10-13: 25 mg via ORAL
  Filled 2015-10-12 (×2): qty 1

## 2015-10-12 NOTE — ED Triage Notes (Signed)
Pt with nasal congestion, coughing up green sputum, body aches x1 week.  Pt appears to triage with shortness of breath, wheezing, appears anxious.  O2 sats adequate.  RR 26.

## 2015-10-12 NOTE — Progress Notes (Signed)
Pt placed on [facility] C-5 CPAP.  CPAP plugged into red outlet. Pt has a nasal mask. Tolerating well.

## 2015-10-12 NOTE — H&P (Signed)
[place] [facility] [place] NAME: [name] Golden    MR#:  [id number]  DATE OF BIRTH:  08-16-1975  DATE OF ADMISSION:  10/12/2015  PRIMARY CARE PHYSICIAN: Enid Derry, MD   REQUESTING/REFERRING PHYSICIAN: Dr Joni Fears  CHIEF COMPLAINT:  Shortness of breath and wheezing  HISTORY OF PRESENT ILLNESS:  [name] Golden  is a 40 y.o. female with a known history of Diabetes and bipolar who presents with above complaint. Patient reports over the past few day she's had increasing shortness of breath wheezing and sputum production. She says that one of her sons also had similar symptoms. He was evaluated in the emergency room a few days ago and given antibiotics. In the emergency room here patient has received 3 nebulizer treatments however remains with bilateral wheezing. Chest x-ray does not show evidence of pneumonia. She is started on IV steroids. Patient denies fever or chills.  PAST MEDICAL HISTORY:   Past Medical History:  Diagnosis Date  . Anxiety   . Arthritis    right foot and knee  . Bipolar 1 disorder ([place])   . Bipolar 1 disorder, depressed, mild ([place]) 06/03/2015  . Depression   . Diabetes ([place])    diet controlled  . Endometriosis   . GERD (gastroesophageal reflux disease)   . Headache    sporadic - stress/allergies  . Heart murmur    as child  . High cholesterol   . History of DVT (deep vein thrombosis) 2004   leg, neck, arm  . History of ovarian cyst   . History of pulmonary embolus (PE) 2004  . Hypertension   . Increased BMI (body mass index)   . Menopausal symptom   . Osteoporosis   . Painful intercourse   . Sleep apnea    CPAP  . Vitamin D deficiency     PAST SURGICAL HISTORY:   Past Surgical History:  Procedure Laterality Date  . ABDOMINAL HYSTERECTOMY    . APPENDECTOMY    . BREAST SURGERY    . COLONOSCOPY WITH PROPOFOL N/A 11/17/2014   Procedure: COLONOSCOPY WITH PROPOFOL;  Surgeon: Lucilla Lame, MD;  Location: [place];  Service: Endoscopy;  Laterality: N/A;  CPAP Diabetic - diet controlled  . laparoscopic adhesions removal for endometriosis    . MEDIAL PARTIAL KNEE REPLACEMENT  02/13/2015   Dr. Jefm Bryant    SOCIAL HISTORY:   Social History  Substance Use Topics  . Smoking status: Current Some Day Smoker    Packs/day: 0.50    Years: 23.00    Types: Cigarettes    Last attempt to quit: 12/03/2014  . Smokeless tobacco: Never Used     Comment: off and on smoker since age 83  . Alcohol use 0.0 oz/week     Comment: Occasional    FAMILY HISTORY:   Family History  Problem Relation Age of Onset  . Diabetes Mother   . Hypertension Mother   . Cancer Mother     cervical  . Hypertension Father   . Cancer Maternal Aunt     pancreatic  . Diabetes Paternal Aunt   . Cancer Paternal Aunt     breast  . Heart disease Paternal Uncle     hole in heart  . Hypertension Maternal Grandmother   . Cancer Maternal Grandfather     lung  . Hypertension Maternal Grandfather   . Heart disease Paternal Grandmother   . Thyroid disease Paternal Grandmother   . Hypertension Paternal Grandmother   .  Diabetes Paternal Grandfather   . Stroke Paternal Grandfather   . Hypertension Paternal Grandfather     DRUG ALLERGIES:   Allergies  Allergen Reactions  . Allegra [Fexofenadine] Anaphylaxis and Swelling  . Codeine Anaphylaxis and Swelling  . Hydrocodone Anaphylaxis and Swelling  . Chantix [Varenicline] Other (See Comments)    Homicidal, aggressive  . Clindamycin/Lincomycin Diarrhea  . Tomato Swelling    Seeded fruits and vegetables sometimes cause itching and lips to swell  . Penicillins Itching    Has patient had a PCN reaction causing immediate rash, facial/tongue/throat swelling, SOB or lightheadedness with hypotension: No Has patient had a PCN reaction causing severe rash involving mucus membranes or skin necrosis: No Has patient had a PCN reaction that required hospitalization No Has patient  had a PCN reaction occurring within the last 10 years: possibly. Pt isn't sure of the last time she took it. Thinks she was in her 20s If all of the above answers are "NO", then may proceed with Cepha    REVIEW OF SYSTEMS:   Review of Systems  Constitutional: Negative.  Negative for chills, fever and malaise/fatigue.  HENT: Negative.  Negative for ear discharge, ear pain, hearing loss, nosebleeds and sore throat.   Eyes: Negative.  Negative for blurred vision and pain.  Respiratory: Positive for cough, sputum production, shortness of breath and wheezing. Negative for hemoptysis.   Cardiovascular: Negative.  Negative for chest pain, palpitations and leg swelling.  Gastrointestinal: Negative.  Negative for abdominal pain, blood in stool, diarrhea, nausea and vomiting.  Genitourinary: Negative.  Negative for dysuria.  Musculoskeletal: Negative.  Negative for back pain.  Skin: Negative.   Neurological: Negative for dizziness, tremors, speech change, focal weakness, seizures and headaches.  Endo/Heme/Allergies: Negative.  Does not bruise/bleed easily.  Psychiatric/Behavioral: Negative.  Negative for depression, hallucinations and suicidal ideas.    MEDICATIONS AT HOME:   Prior to Admission medications   Medication Sig Start Date End Date Taking? Authorizing Provider  atorvastatin (LIPITOR) 10 MG tablet TAKE 1 TABLET BY MOUTH AT BEDTIME FOR CHOLESTEROL 02/07/15  Yes Melinda P Lada, MD  clonazePAM (KLONOPIN) 1 MG tablet 1 mg 3 (three) times daily as needed. Take 1 tablet by mouth twice a day for anxiety 02/09/15  Yes Historical Provider, MD  gabapentin (NEURONTIN) 300 MG capsule TAKE 1 CAPSULE (300 MG TOTAL) BY MOUTH 3 (THREE) TIMES DAILY. 06/08/15  Yes Matthew R Wagoner, DPM  hydrochlorothiazide (HYDRODIURIL) 25 MG tablet Take 1 tablet (25 mg total) by mouth daily. 08/15/15  Yes Melinda P Lada, MD  Oxcarbazepine (TRILEPTAL) 300 MG tablet Take 600 tablets by mouth 2 (two) times daily.  02/09/15  Yes  Historical Provider, MD  QUEtiapine (SEROQUEL) 400 MG tablet Take 400 mg by mouth at bedtime.   Yes Historical Provider, MD  Vitamin D, Ergocalciferol, (DRISDOL) 50000 units CAPS capsule Take 1 capsule (50,000 Units total) by mouth every 30 (thirty) days. 06/10/15  Yes Melinda P Lada, MD  canagliflozin (INVOKANA) 100 MG TABS tablet Take 1 tablet (100 mg total) by mouth daily. 07/15/15   Melinda P Lada, MD  metFORMIN (GLUCOPHAGE XR) 500 MG 24 hr tablet Take 2 tablets (1,000 mg total) by mouth daily with breakfast. 05/08/15   Melinda P Lada, MD      VITAL SIGNS:  Blood pressure 121/78, pulse 83, temperature 99.8 F (37.7 C), temperature source Oral, resp. rate (!) 26, height 5' 7" (1.702 m), weight 124.3 kg (274 lb), last menstrual period 05/19/2013, SpO2 99 %.    PHYSICAL EXAMINATION:   Physical Exam  Constitutional: She is oriented to person, place, and time and well-developed, well-nourished, and in no distress. No distress.  HENT:  Head: Normocephalic.  Eyes: No scleral icterus.  Neck: Normal range of motion. Neck supple. No JVD present. No tracheal deviation present.  Cardiovascular: Normal rate, regular rhythm and normal heart sounds.  Exam reveals no gallop and no friction rub.   No murmur heard. Pulmonary/Chest: No respiratory distress. She has no wheezes. She has no rales. She exhibits no tenderness.  Bilateral wheezing and decreased air movement  Abdominal: Soft. Bowel sounds are normal. She exhibits no distension and no mass. There is no tenderness. There is no rebound and no guarding.  Musculoskeletal: Normal range of motion. She exhibits no edema.  Neurological: She is alert and oriented to person, place, and time.  Skin: Skin is warm. No rash noted. No erythema.  Psychiatric: Affect and judgment normal.      LABORATORY PANEL:   CBC  Recent Labs Lab 10/12/15 1340  WBC 11.2*  HGB 13.3  HCT 40.5  PLT 303    ------------------------------------------------------------------------------------------------------------------  Chemistries   Recent Labs Lab 10/12/15 1340  NA 138  K 3.4*  CL 105  CO2 27  GLUCOSE 97  BUN 10  CREATININE 0.78  CALCIUM 9.4  AST 16  ALT 15  ALKPHOS 53  BILITOT 0.7   ------------------------------------------------------------------------------------------------------------------  Cardiac Enzymes No results for input(s): TROPONINI in the last 168 hours. ------------------------------------------------------------------------------------------------------------------  RADIOLOGY:  Dg Chest 2 View  Result Date: 10/12/2015 CLINICAL DATA:  Pt states she has been wheezing x 5 days, sinus pressure/congestion x 2-3 days, coughing up yellow, green, brown mucous. Hx of sinus issues, high blood pressure, diabetes, no surgeries to the chest area. LMP Hystorectomy 2004. EXAM: CHEST  2 VIEW COMPARISON:  05/30/2010 FINDINGS: The heart size and mediastinal contours are within normal limits. Both lungs are clear. The visualized skeletal structures are unremarkable. IMPRESSION: No active cardiopulmonary disease. Electronically Signed   By: Elizabeth  Brown [name]   On: 10/12/2015 15:00    EKG:    IMPRESSION AND PLAN:   40-year-old female with a history of diabetes and bipolar who presents with acute reactive airway disease with wheezing and acute hepatitis.  1. Acute reactive airway disease with wheezing: Patient is started on IV steroids and we will continue nebulizer treatments.  2. Acute bronchitis: Patient is started on azithromycin. 3. Diabetes: Continue outpatient regimen with metformin and sliding scale insulin with ADA diet.  4. Hypokalemia: Replete and recheck in a.m.  5. Bipolar affective disorder: Patient will continue outpatient medications.   6. Tobacco dependence: Patient has tried very hard to quit smoking with nicotine patch and inhaler kit as well as  Chantix. I encouraged patient to continue to try to quit. Patient counseled 3 minutes.   All the records are reviewed and case discussed with ED provider. Management plans discussed with the patient and she in agreement  CODE STATUS: full  TOTAL TIME TAKING CARE OF THIS PATIENT: 45 minutes.    ,  M.D on 10/12/2015 at 4:57 PM  Between 7am to 6pm - Pager - [phone]  After 6pm go to [email or web address] - password [email or web address]  [facility]  Office  [phone]  CC: Primary care physician; Melinda Lada, MD     

## 2015-10-12 NOTE — ED Provider Notes (Signed)
Assume care of patient from Dr. Ermalinda Barrios, plan to reassess after nebs and steroids for improvement of wheezing.  At 4:30 PM, I did reassess the patient, she is still severely wheezing, audibly without auscultation. With auscultation she has diffuse inspiratory and expiratory wheezing, prolonged expiratory phase. She also reports that she has not been able to eat or drink much over the last few days because of the symptoms. She's been using a CPAP machine at home for the last 2 weeks because she's been feeling short of breath, worse at night, and she was afraid that she would stop breathing while she was asleep.  Due to her refractory symptoms, I discussed the case with the hospitalist for further management. I will give her another DuoNeb, IV saline, IV magnesium sulfate to continue trying to reverse her symptoms.   Carrie Mew, MD 10/12/15 [phone]

## 2015-10-12 NOTE — Progress Notes (Signed)
Anticoagulation monitoring(Lovenox):  40 yo  female ordered Lovenox 40 mg Q24h  Filed Weights   10/12/15 1251  Weight: 274 lb (124.3 kg)   BMI 43   Lab Results  Component Value Date   CREATININE 0.78 10/12/2015   CREATININE 0.72 07/06/2015   CREATININE 0.83 01/23/2015   Estimated Creatinine Clearance: 127.9 mL/min (by C-G formula based on SCr of 0.78 mg/dL). Hemoglobin & Hematocrit     Component Value Date/Time   HGB 13.3 10/12/2015 1340   HGB 12.2 12/29/2012 1854   HCT 40.5 10/12/2015 1340   HCT 38.2 01/19/2015 1353     Per Protocol for Patient with estCrcl > 30 ml/min and BMI  >40, will transition to Lovenox 40 mg Q12h.

## 2015-10-12 NOTE — ED Provider Notes (Signed)
[facility] Emergency Department Provider Note   ____________________________________________    I have reviewed the triage vital signs and the nursing notes.   HISTORY  Chief Complaint Cough; Nasal Congestion; Generalized Body Aches; and Wheezing     HPI [name] Golden is a 40 y.o. female who presents with cough and wheezing. Patient reports upper respiratory symptoms over the last 3-4 days. She woke up this morning and had chest tightness with difficulty breathing. She denies a history of asthma. She does smoke cigarettes. She has never had wheezing before. She denies fevers or chills. No recent travel. No calf pain or swelling.   Past Medical History:  Diagnosis Date  . Anxiety   . Arthritis    right foot and knee  . Bipolar 1 disorder ([place])   . Bipolar 1 disorder, depressed, mild ([place]) 06/03/2015  . Depression   . Diabetes ([place])    diet controlled  . Endometriosis   . GERD (gastroesophageal reflux disease)   . Headache    sporadic - stress/allergies  . Heart murmur    as child  . High cholesterol   . History of DVT (deep vein thrombosis) 2004   leg, neck, arm  . History of ovarian cyst   . History of pulmonary embolus (PE) 2004  . Hypertension   . Increased BMI (body mass index)   . Menopausal symptom   . Osteoporosis   . Painful intercourse   . Sleep apnea    CPAP  . Vitamin D deficiency     Patient Active Problem List   Diagnosis Date Noted  . Medication monitoring encounter 07/06/2015  . Bilateral ovarian cysts 06/28/2015  . Bipolar 1 disorder, depressed, mild ([place]) 06/03/2015  . Postoperative pain of right knee 05/15/2015  . Ulnar neuropathy of left upper extremity 03/25/2015  . Tobacco abuse 03/25/2015  . History of pulmonary embolus (PE) 01/24/2015  . Neoplasm of skin of axilla 01/19/2015  . Hidradenitis suppurativa 01/19/2015  . Constipation 11/27/2014  . Vitamin D deficiency 11/27/2014  . Microcytosis  11/27/2014  . Elevated ferritin 11/27/2014  . Change in bowel habits   . Chronic abdominal pain 09/14/2014  . Posttraumatic stress disorder 09/14/2014  . Diabetes mellitus type 2, uncontrolled ([place]) 09/14/2014  . Hyperlipidemia 09/14/2014  . Morbid obesity ([place]) 09/14/2014  . Essential hypertension, benign 09/14/2014  . Acne inversa 08/26/2012    Past Surgical History:  Procedure Laterality Date  . ABDOMINAL HYSTERECTOMY    . APPENDECTOMY    . BREAST SURGERY    . COLONOSCOPY WITH PROPOFOL N/A 11/17/2014   Procedure: COLONOSCOPY WITH PROPOFOL;  Surgeon: Lucilla Lame, MD;  Location: [facility];  Service: Endoscopy;  Laterality: N/A;  CPAP Diabetic - diet controlled  . laparoscopic adhesions removal for endometriosis    . MEDIAL PARTIAL KNEE REPLACEMENT  02/13/2015   Dr. Jefm Bryant    Prior to Admission medications   Medication Sig Start Date End Date Taking? Authorizing Provider  atorvastatin (LIPITOR) 10 MG tablet TAKE 1 TABLET BY MOUTH AT BEDTIME FOR CHOLESTEROL 02/07/15  Yes Arnetha Courser, MD  clonazePAM (KLONOPIN) 1 MG tablet 1 mg 3 (three) times daily as needed. Take 1 tablet by mouth twice a day for anxiety 02/09/15  Yes Historical Provider, MD  gabapentin (NEURONTIN) 300 MG capsule TAKE 1 CAPSULE (300 MG TOTAL) BY MOUTH 3 (THREE) TIMES DAILY. 06/08/15  Yes Trula Slade, DPM  hydrochlorothiazide (HYDRODIURIL) 25 MG tablet Take 1 tablet (25 mg total)  by mouth daily. 08/15/15  Yes Arnetha Courser, MD  Oxcarbazepine (TRILEPTAL) 300 MG tablet Take 600 tablets by mouth 2 (two) times daily.  02/09/15  Yes Historical Provider, MD  QUEtiapine (SEROQUEL) 400 MG tablet Take 400 mg by mouth at bedtime.   Yes Historical Provider, MD  Vitamin D, Ergocalciferol, (DRISDOL) 50000 units CAPS capsule Take 1 capsule (50,000 Units total) by mouth every 30 (thirty) days. 06/10/15  Yes Arnetha Courser, MD  canagliflozin (INVOKANA) 100 MG TABS tablet Take 1 tablet (100 mg total) by mouth daily.  07/15/15   Arnetha Courser, MD  metFORMIN (GLUCOPHAGE XR) 500 MG 24 hr tablet Take 2 tablets (1,000 mg total) by mouth daily with breakfast. 05/08/15   Arnetha Courser, MD     Allergies Allegra [fexofenadine]; Codeine; Hydrocodone; Chantix [varenicline]; Clindamycin/lincomycin; Tomato; and Penicillins  Family History  Problem Relation Age of Onset  . Diabetes Mother   . Hypertension Mother   . Cancer Mother     cervical  . Hypertension Father   . Cancer Maternal Aunt     pancreatic  . Diabetes Paternal Aunt   . Cancer Paternal Aunt     breast  . Heart disease Paternal Uncle     hole in heart  . Hypertension Maternal Grandmother   . Cancer Maternal Grandfather     lung  . Hypertension Maternal Grandfather   . Heart disease Paternal Grandmother   . Thyroid disease Paternal Grandmother   . Hypertension Paternal Grandmother   . Diabetes Paternal Grandfather   . Stroke Paternal Grandfather   . Hypertension Paternal Grandfather     Social History Social History  Substance Use Topics  . Smoking status: Current Some Day Smoker    Packs/day: 0.50    Years: 23.00    Types: Cigarettes    Last attempt to quit: 12/03/2014  . Smokeless tobacco: Never Used     Comment: off and on smoker since age 33  . Alcohol use 0.0 oz/week     Comment: Occasional    Review of Systems  Constitutional: No fever/chills Eyes: No visual changes.  ENT: No sore throat. Cardiovascular: Chest tightness as above Respiratory: Wheezing as above Gastrointestinal: No abdominal pain.  No nausea, no vomiting.   Genitourinary: Negative for dysuria. Musculoskeletal: Negative for joint swelling Skin: Negative for rash. Neurological: Negative for headaches   10-point ROS otherwise negative.  ____________________________________________   PHYSICAL EXAM:  VITAL SIGNS: ED Triage Vitals  Enc Vitals Group     BP 10/12/15 1330 121/78     Pulse Rate 10/12/15 1251 (!) 113     Resp 10/12/15 1251 20      Temp 10/12/15 1251 99.8 F (37.7 C)     Temp Source 10/12/15 1251 Oral     SpO2 10/12/15 1251 98 %     Weight 10/12/15 1251 274 lb (124.3 kg)     Height 10/12/15 1251 5\' 7"  (1.702 m)     Head Circumference --      Peak Flow --      Pain Score 10/12/15 1251 4     Pain Loc --      Pain Edu? --      Excl. in [place]? --     Constitutional: Alert and oriented. No acute distress Eyes: Conjunctivae are normal.  Head: Atraumatic. Nose: No congestion/rhinnorhea.  Cardiovascular: Normal rate, regular rhythm. Grossly normal heart sounds.  Good peripheral circulation. Respiratory: Increased respiratory effort.  No retractions. Moderate wheezing diffusely Gastrointestinal:  Soft and nontender. No distention.  No CVA tenderness. Genitourinary: deferred Musculoskeletal: No lower extremity tenderness nor edema.   Neurologic:  Normal speech and language. No gross focal neurologic deficits are appreciated.  Skin:  Skin is warm, dry and intact. No rash noted. Psychiatric: Mood and affect are normal. Speech and behavior are normal.  ____________________________________________   LABS (all labs ordered are listed, but only abnormal results are displayed)  Labs Reviewed  COMPREHENSIVE METABOLIC PANEL - Abnormal; Notable for the following:       Result Value   Potassium 3.4 (*)    All other components within normal limits  CBC WITH DIFFERENTIAL/PLATELET - Abnormal; Notable for the following:    WBC 11.2 (*)    RBC 5.30 (*)    MCV 76.3 (*)    MCH 25.1 (*)    RDW 14.9 (*)    Lymphs Abs 5.5 (*)    All other components within normal limits   ____________________________________________  EKG  None ____________________________________________  RADIOLOGY  Chest x-ray unremarkable ____________________________________________   PROCEDURES  Procedure(s) performed: No    Critical Care performed: No ____________________________________________   INITIAL IMPRESSION / ASSESSMENT AND PLAN  / ED COURSE  Pertinent labs & imaging results that were available during my care of the patient were reviewed by me and considered in my medical decision making (see chart for details).  Patient presents with significant wheezing, suspect bronchospasm related to smoking and URI. We will treat with Solu-Medrol, nebulizers and reevaluate  Clinical Course  ----------------------------------------- 3:48 PM on 10/12/2015 -----------------------------------------  Patient pending Solu-Medrol and additional nebulizers. I've asked Dr. Joni Fears follow-up and reevaluate the patient and dispose appropriately. The patient is very against being admitted to the hospital ____________________________________________   FINAL CLINICAL IMPRESSION(S) / ED DIAGNOSES  Wheezing or bronchospasm   NEW MEDICATIONS STARTED DURING THIS VISIT:  New Prescriptions   No medications on file     Note:  This document was prepared using Dragon voice recognition software and may include unintentional dictation errors.    Lavonia Drafts, MD 10/12/15 [phone]

## 2015-10-13 DIAGNOSIS — J441 Chronic obstructive pulmonary disease with (acute) exacerbation: Secondary | ICD-10-CM

## 2015-10-13 DIAGNOSIS — E669 Obesity, unspecified: Secondary | ICD-10-CM

## 2015-10-13 DIAGNOSIS — E119 Type 2 diabetes mellitus without complications: Secondary | ICD-10-CM

## 2015-10-13 DIAGNOSIS — E876 Hypokalemia: Secondary | ICD-10-CM

## 2015-10-13 DIAGNOSIS — J209 Acute bronchitis, unspecified: Secondary | ICD-10-CM

## 2015-10-13 DIAGNOSIS — F319 Bipolar disorder, unspecified: Secondary | ICD-10-CM

## 2015-10-13 DIAGNOSIS — D72829 Elevated white blood cell count, unspecified: Secondary | ICD-10-CM

## 2015-10-13 LAB — BASIC METABOLIC PANEL
Anion gap: 5 (ref 5–15)
BUN: 11 mg/dL (ref 6–20)
CHLORIDE: 108 mmol/L (ref 101–111)
CO2: 24 mmol/L (ref 22–32)
Calcium: 9 mg/dL (ref 8.9–10.3)
Creatinine, Ser: 0.74 mg/dL (ref 0.44–1.00)
Glucose, Bld: 166 mg/dL — ABNORMAL HIGH (ref 65–99)
POTASSIUM: 4.1 mmol/L (ref 3.5–5.1)
SODIUM: 137 mmol/L (ref 135–145)

## 2015-10-13 LAB — CBC
HEMATOCRIT: 40.2 % (ref 35.0–47.0)
Hemoglobin: 13.3 g/dL (ref 12.0–16.0)
MCH: 25.3 pg — ABNORMAL LOW (ref 26.0–34.0)
MCHC: 33.2 g/dL (ref 32.0–36.0)
MCV: 76.2 fL — AB (ref 80.0–100.0)
PLATELETS: 308 10*3/uL (ref 150–440)
RBC: 5.27 MIL/uL — AB (ref 3.80–5.20)
RDW: 15 % — ABNORMAL HIGH (ref 11.5–14.5)
WBC: 14.6 10*3/uL — AB (ref 3.6–11.0)

## 2015-10-13 LAB — GLUCOSE, CAPILLARY: Glucose-Capillary: 109 mg/dL — ABNORMAL HIGH (ref 65–99)

## 2015-10-13 MED ORDER — NICOTINE 21 MG/24HR TD PT24: 21.0000 mg | MEDICATED_PATCH | Freq: Every day | TRANSDERMAL | 0 refills | Status: AC

## 2015-10-13 MED ORDER — GUAIFENESIN ER 600 MG PO TB12: 600.0000 mg | ORAL_TABLET | Freq: Two times a day (BID) | ORAL | 0 refills | Status: DC

## 2015-10-13 MED ORDER — PREDNISONE 10 MG (21) PO TBPK: 10.0000 mg | ORAL_TABLET | Freq: Every day | ORAL | 0 refills | Status: DC

## 2015-10-13 MED ORDER — NICOTINE 21 MG/24HR TD PT24
21.0000 mg | MEDICATED_PATCH | Freq: Every day | TRANSDERMAL | Status: DC
  Administered 2015-10-13: 21 mg via TRANSDERMAL
  Filled 2015-10-13: qty 1

## 2015-10-13 MED ORDER — AZITHROMYCIN 250 MG PO TABS: 250.0000 mg | ORAL_TABLET | Freq: Every day | ORAL | 0 refills | Status: DC

## 2015-10-13 MED ORDER — IPRATROPIUM-ALBUTEROL 18-103 MCG/ACT IN AERO: 2.0000 | INHALATION_SPRAY | RESPIRATORY_TRACT | 3 refills | Status: AC | PRN

## 2015-10-13 NOTE — Progress Notes (Signed)
Pt being discharged home today. PIV removed. Discharge instructions reviewed with pt, all questions answered. Prescriptions both given to pt and sent to pharmacy. Pt was educated on medications and to take abx/steriods entirely. Pt to schedule follow up appointment with her PCP. She is leaving with all her belongings, will be transported home via family member.

## 2015-10-13 NOTE — Discharge Summary (Signed)
[place] [facility] [place] NAME: [name] Golden    MR#:  [id number]  DATE OF BIRTH:  1975-12-07  DATE OF ADMISSION:  10/12/2015 ADMITTING PHYSICIAN: Bettey Costa, MD  DATE OF DISCHARGE: 10/13/2015 11:23 AM  PRIMARY CARE PHYSICIAN: Enid Derry, MD     ADMISSION DIAGNOSIS:  Wheezing [R06.2] Bronchitis [J40]  DISCHARGE DIAGNOSIS:  Principal Problem:   Reactive airway disease with wheezing Active Problems:   COPD exacerbation (HCC)   Acute bronchitis   Leukocytosis   Hypokalemia   Bipolar disorder ([place])   Diabetes mellitus ([place])   Obesity   SECONDARY DIAGNOSIS:   Past Medical History:  Diagnosis Date  . Anxiety   . Arthritis    right foot and knee  . Bipolar 1 disorder ([place])   . Bipolar 1 disorder, depressed, mild ([place]) 06/03/2015  . Depression   . Diabetes ([place])    diet controlled  . Endometriosis   . GERD (gastroesophageal reflux disease)   . Headache    sporadic - stress/allergies  . Heart murmur    as child  . High cholesterol   . History of DVT (deep vein thrombosis) 2004   leg, neck, arm  . History of ovarian cyst   . History of pulmonary embolus (PE) 2004  . Hypertension   . Increased BMI (body mass index)   . Menopausal symptom   . Osteoporosis   . Painful intercourse   . Sleep apnea    CPAP  . Vitamin D deficiency     .pro HOSPITAL COURSE:   The patient is 40 year old female with past medical history significant for history of ongoing tobacco abuse, anxiety, diabetes, depression, gastroesophageal reflux disease, hyperlipidemia, pulmonary embolism, who presents to the hospital with complaints of shortness of breath and wheezing, some cough with no significant sputum production. Patient is of having some green phlegm but not too much of expectoration overall. On arrival to the hospital patient. Chest x-ray was normal, however, patient continued to wheeze after 3 nebulizers in the emergency room and was  admitted to the hospital for evaluation and treatment. She was initiated on IV steroids, Antibiotic therapy, nebulizers and improved on the day of discharge, she feels satisfactory, had no shortness of breath or wheezing, however, oxygenation remained stable. She was felt to be stable to be discharged home   discussion by problem: #1. COPD exacerbation, patient is to continue antibiotics, steroid taper, inhalers, she was counseled to stop smoking #2. Acute bronchitis, patient is to continue Zithromax to complete course #3. Leukocytosis, some worse today, likely due to steroids, follow as outpatient #4. Hypokalemia, resolved #5. Bipolar disorder, patient is to continue outpatient medications #6. Tobacco abuse. Counseling, discussed this patient for 3-4 minutes, nicotine replacement therapy to be continued at home, patient is agreeable  DISCHARGE CONDITIONS:   Stable   CONSULTS OBTAINED:    DRUG ALLERGIES:   Allergies  Allergen Reactions  . Allegra [Fexofenadine] Anaphylaxis and Swelling  . Codeine Anaphylaxis and Swelling  . Hydrocodone Anaphylaxis and Swelling  . Chantix [Varenicline] Other (See Comments)    Homicidal, aggressive  . Clindamycin/Lincomycin Diarrhea  . Tomato Swelling    Seeded fruits and vegetables sometimes cause itching and lips to swell  . Penicillins Itching    Has patient had a PCN reaction causing immediate rash, facial/tongue/throat swelling, SOB or lightheadedness with hypotension: No Has patient had a PCN reaction causing severe rash involving mucus membranes or skin necrosis: No Has patient had a  PCN reaction that required hospitalization No Has patient had a PCN reaction occurring within the last 10 years: possibly. Pt isn't sure of the last time she took it. Thinks she was in her 16s If all of the above answers are "NO", then may proceed with Cepha    DISCHARGE MEDICATIONS:   Discharge Medication List as of 10/13/2015  9:00 AM    START taking these  medications   Details  albuterol-ipratropium (COMBIVENT) 18-103 MCG/ACT inhaler Inhale 2 puffs into the lungs every 4 (four) hours as needed for wheezing or shortness of breath., Starting Fri 10/13/2015, Normal    azithromycin (ZITHROMAX) 250 MG tablet Take 1 tablet (250 mg total) by mouth daily., Starting Fri 10/13/2015, Print    guaiFENesin (MUCINEX) 600 MG 12 hr tablet Take 1 tablet (600 mg total) by mouth 2 (two) times daily., Starting Fri 10/13/2015, Normal    nicotine (NICODERM CQ - DOSED IN MG/24 HOURS) 21 mg/24hr patch Place 1 patch (21 mg total) onto the skin daily., Starting Fri 10/13/2015, Normal    predniSONE (STERAPRED UNI-PAK 21 TAB) 10 MG (21) TBPK tablet Take 1 tablet (10 mg total) by mouth daily. Please take 6 pills in the morning on the day 1, then taper by one pill daily until finished, thank you, Starting Fri 10/13/2015, Normal      CONTINUE these medications which have NOT CHANGED   Details  atorvastatin (LIPITOR) 10 MG tablet TAKE 1 TABLET BY MOUTH AT BEDTIME FOR CHOLESTEROL, Normal    clonazePAM (KLONOPIN) 1 MG tablet 1 mg 3 (three) times daily as needed. Take 1 tablet by mouth twice a day for anxiety, Starting Thu 02/09/2015, Historical Med    gabapentin (NEURONTIN) 300 MG capsule TAKE 1 CAPSULE (300 MG TOTAL) BY MOUTH 3 (THREE) TIMES DAILY., Normal    hydrochlorothiazide (HYDRODIURIL) 25 MG tablet Take 1 tablet (25 mg total) by mouth daily., Starting Tue 08/15/2015, Normal    Oxcarbazepine (TRILEPTAL) 300 MG tablet Take 600 tablets by mouth 2 (two) times daily. , Starting Thu 02/09/2015, Historical Med    QUEtiapine (SEROQUEL) 400 MG tablet Take 400 mg by mouth at bedtime., Historical Med    Vitamin D, Ergocalciferol, (DRISDOL) 50000 units CAPS capsule Take 1 capsule (50,000 Units total) by mouth every 30 (thirty) days., Starting Sat 06/10/2015, Normal      STOP taking these medications     canagliflozin (INVOKANA) 100 MG TABS tablet      metFORMIN (GLUCOPHAGE XR)  500 MG 24 hr tablet          DISCHARGE INSTRUCTIONS:    Patient is to follow-up with primary care physician as outpatient   If you experience worsening of your admission symptoms, develop shortness of breath, life threatening emergency, suicidal or homicidal thoughts you must seek medical attention immediately by calling 911 or calling your MD immediately  if symptoms less severe.  You Must read complete instructions/literature along with all the possible adverse reactions/side effects for all the Medicines you take and that have been prescribed to you. Take any new Medicines after you have completely understood and accept all the possible adverse reactions/side effects.   Please note  You were cared for by a hospitalist during your hospital stay. If you have any questions about your discharge medications or the care you received while you were in the hospital after you are discharged, you can call the unit and asked to speak with the hospitalist on call if the hospitalist that took care of you is  not available. Once you are discharged, your primary care physician will handle any further medical issues. Please note that NO REFILLS for any discharge medications will be authorized once you are discharged, as it is imperative that you return to your primary care physician (or establish a relationship with a primary care physician if you do not have one) for your aftercare needs so that they can reassess your need for medications and monitor your lab values.    Today   CHIEF COMPLAINT:   Chief Complaint  Patient presents with  . Cough  . Nasal Congestion  . Generalized Body Aches  . Wheezing    HISTORY OF PRESENT ILLNESS:  [name] Golden  is a 40 y.o. female with a known history of ongoing tobacco abuse, anxiety, diabetes, depression, gastroesophageal reflux disease, hyperlipidemia, pulmonary embolism, who presents to the hospital with complaints of shortness of breath and wheezing, some  cough with no significant sputum production. Patient is of having some green phlegm but not too much of expectoration overall. On arrival to the hospital patient. Chest x-ray was normal, however, patient continued to wheeze after 3 nebulizers in the emergency room and was admitted to the hospital for evaluation and treatment. She was initiated on IV steroids, Antibiotic therapy, nebulizers and improved on the day of discharge, she feels satisfactory, had no shortness of breath or wheezing, however, oxygenation remained stable. She was felt to be stable to be discharged home   discussion by problem: #1. COPD exacerbation, patient is to continue antibiotics, steroid taper, inhalers, she was counseled to stop smoking #2. Acute bronchitis, patient is to continue Zithromax to complete course #3. Leukocytosis, some worse today, likely due to steroids, follow as outpatient #4. Hypokalemia, resolved #5. Bipolar disorder, patient is to continue outpatient medications #6. Tobacco abuse. Counseling, discussed this patient for 3-4 minutes, nicotine replacement therapy to be continued at home, patient is agreeable    VITAL SIGNS:  Blood pressure (!) 141/74, pulse 81, temperature 97.8 F (36.6 C), temperature source Oral, resp. rate 18, height 5\' 7"  (1.702 m), weight 124.3 kg (274 lb), last menstrual period 05/19/2013, SpO2 93 %.  I/O:   Intake/Output Summary (Last 24 hours) at 10/13/15 1300 Last data filed at 10/13/15 0900  Gross per 24 hour  Intake              240 ml  Output                0 ml  Net              240 ml    PHYSICAL EXAMINATION:  GENERAL:  40 y.o.-year-old patient lying in the bed with no acute distress.  EYES: Pupils equal, round, reactive to light and accommodation. No scleral icterus. Extraocular muscles intact.  HEENT: Head atraumatic, normocephalic. Oropharynx and nasopharynx clear.  NECK:  Supple, no jugular venous distention. No thyroid enlargement, no tenderness.  LUNGS:  Normal breath sounds bilaterally, no wheezing, rales,rhonchi or crepitation. No use of accessory muscles of respiration.  CARDIOVASCULAR: S1, S2 normal. No murmurs, rubs, or gallops.  ABDOMEN: Soft, non-tender, non-distended. Bowel sounds present. No organomegaly or mass.  EXTREMITIES: No pedal edema, cyanosis, or clubbing.  NEUROLOGIC: Cranial nerves II through XII are intact. Muscle strength 5/5 in all extremities. Sensation intact. Gait not checked.  PSYCHIATRIC: The patient is alert and oriented x 3.  SKIN: No obvious rash, lesion, or ulcer.   DATA REVIEW:   CBC  Recent Labs Lab 10/13/15 [phone]  WBC 14.6*  HGB 13.3  HCT 40.2  PLT 308    Chemistries   Recent Labs Lab 10/12/15 1340 10/13/15 0506  NA 138 137  K 3.4* 4.1  CL 105 108  CO2 27 24  GLUCOSE 97 166*  BUN 10 11  CREATININE 0.78 0.74  CALCIUM 9.4 9.0  AST 16  --   ALT 15  --   ALKPHOS 53  --   BILITOT 0.7  --     Cardiac Enzymes No results for input(s): TROPONINI in the last 168 hours.  Microbiology Results  Results for orders placed or performed in visit on 01/19/15  Microscopic Examination     Status: None   Collection Time: 01/19/15  1:53 PM  Result Value Ref Range Status   WBC, UA None seen 0 - 5 /hpf Final   RBC, UA 0-2 0 - 2 /hpf Final   Epithelial Cells (non renal) 0-10 0 - 10 /hpf Final   Bacteria, UA Few None seen/Few Final    RADIOLOGY:  Dg Chest 2 View  Result Date: 10/12/2015 CLINICAL DATA:  Pt states she has been wheezing x 5 days, sinus pressure/congestion x 2-3 days, coughing up yellow, green, brown mucous. Hx of sinus issues, high blood pressure, diabetes, no surgeries to the chest area. LMP Hystorectomy 2004. EXAM: CHEST  2 VIEW COMPARISON:  05/30/2010 FINDINGS: The heart size and mediastinal contours are within normal limits. Both lungs are clear. The visualized skeletal structures are unremarkable. IMPRESSION: No active cardiopulmonary disease. Electronically Signed   By: Nolon Nations [name]   On: 10/12/2015 15:00    EKG:   Orders placed or performed in visit on 11/19/12  . EKG 12-Lead      Management plans discussed with the patient, family and they are in agreement.  CODE STATUS:     Code Status Orders        Start     Ordered   10/12/15 1807  Full code  Continuous     10/12/15 1806    Code Status History    Date Active Date Inactive Code Status Order ID Comments User Context   This patient has a current code status but no historical code status.      TOTAL TIME TAKING CARE OF THIS PATIENT: 40 minutes .    Theodoro Grist M.D on 10/13/2015 at 1:00 PM  Between 7am to 6pm - Pager - [phone]  After 6pm go to [email or web address] - password [email or web address] [facility]  Office  [phone]  CC: Primary care physician; Enid Derry, MD

## 2015-10-18 ENCOUNTER — Other Ambulatory Visit: Payer: Self-pay | Admitting: Podiatry

## 2015-10-19 NOTE — Telephone Encounter (Signed)
Pt needs an appt prior to future refills. 

## 2015-10-23 ENCOUNTER — Other Ambulatory Visit: Payer: Self-pay

## 2015-10-23 MED ORDER — VITAMIN D (ERGOCALCIFEROL) 1.25 MG (50000 UNIT) PO CAPS: 50000.0000 [IU] | ORAL_CAPSULE | ORAL | 2 refills | Status: DC

## 2015-10-24 ENCOUNTER — Ambulatory Visit (INDEPENDENT_AMBULATORY_CARE_PROVIDER_SITE_OTHER): Payer: Medicaid Other | Admitting: Family Medicine

## 2015-10-24 ENCOUNTER — Encounter: Payer: Self-pay | Admitting: Family Medicine

## 2015-10-24 VITALS — BP 112/68 | HR 94 | Temp 99.4°F | Resp 16 | Wt 264.0 lb

## 2015-10-24 DIAGNOSIS — D72828 Other elevated white blood cell count: Secondary | ICD-10-CM

## 2015-10-24 DIAGNOSIS — Z113 Encounter for screening for infections with a predominantly sexual mode of transmission: Secondary | ICD-10-CM

## 2015-10-24 DIAGNOSIS — E1165 Type 2 diabetes mellitus with hyperglycemia: Secondary | ICD-10-CM

## 2015-10-24 DIAGNOSIS — E782 Mixed hyperlipidemia: Secondary | ICD-10-CM

## 2015-10-24 DIAGNOSIS — E559 Vitamin D deficiency, unspecified: Secondary | ICD-10-CM

## 2015-10-24 DIAGNOSIS — Z720 Tobacco use: Secondary | ICD-10-CM | POA: Diagnosis not present

## 2015-10-24 DIAGNOSIS — J209 Acute bronchitis, unspecified: Secondary | ICD-10-CM

## 2015-10-24 MED ORDER — PREDNISONE 10 MG PO TABS: ORAL_TABLET | ORAL | 0 refills | Status: AC

## 2015-10-24 NOTE — Assessment & Plan Note (Signed)
Secondary to steroids and infection

## 2015-10-24 NOTE — Assessment & Plan Note (Signed)
Discussed, encouraged cessation

## 2015-10-24 NOTE — Assessment & Plan Note (Signed)
Continue monthly vit D; check level

## 2015-10-24 NOTE — Assessment & Plan Note (Signed)
Check lipids, today she is fasting; on statin

## 2015-10-24 NOTE — Assessment & Plan Note (Signed)
Extend steroids

## 2015-10-24 NOTE — Assessment & Plan Note (Signed)
labs

## 2015-10-24 NOTE — Patient Instructions (Addendum)
We'll get labs today and let you know the results  It's all water under the bridge -- be safe  I do encourage you to quit smoking Call [phone] to sign up for smoking cessation classes You can call 1-800-QUIT-NOW to talk with a smoking cessation coach  Start a short course of prednisone to clear up the wheezing  12 Ways to Curb Anxiety  ?Anxiety is normal human sensation. It is what helped our ancestors survive the pitfalls of the wilderness. Anxiety is defined as experiencing worry or nervousness about an imminent event or something with an uncertain outcome. It is a feeling experienced by most people at some point in their lives. Anxiety can be triggered by a very personal issue, such as the illness of a loved one, or an event of global proportions, such as a refugee crisis. Some of the symptoms of anxiety are:  Feeling restless.  Having a feeling of impending danger.  Increased heart rate.  Rapid breathing. Sweating.  Shaking.  Weakness or feeling tired.  Difficulty concentrating on anything except the current worry.  Insomnia.  Stomach or bowel problems. What can we do about anxiety we may be feeling? There are many techniques to help manage stress and relax. Here are 12 ways you can reduce your anxiety almost immediately: 1. Turn off the constant feed of information. Take a social media sabbatical. Studies have shown that social media directly contributes to social anxiety.  2. Monitor your television viewing habits. Are you watching shows that are also contributing to your anxiety, such as 24-hour news stations? Try watching something else, or better yet, nothing at all. Instead, listen to music, read an inspirational book or practice a hobby. 3. Eat nutritious meals. Also, don't skip meals and keep healthful snacks on hand. Hunger and poor diet contributes to feeling anxious. 4. Sleep. Sleeping on a regular schedule for at least seven to eight hours a night will do wonders for  your outlook when you are awake. 5. Exercise. Regular exercise will help rid your body of that anxious energy and help you get more restful sleep. 6. Try deep (diaphragmatic) breathing. Inhale slowly through your nose for five seconds and exhale through your mouth. 7. Practice acceptance and gratitude. When anxiety hits, accept that there are things out of your control that shouldn't be of immediate concern.  8. Seek out humor. When anxiety strikes, watch a funny video, read jokes or call a friend who makes you laugh. Laughter is healing for our bodies and releases endorphins that are calming. 9. Stay positive. Take the effort to replace negative thoughts with positive ones. Try to see a stressful situation in a positive light. Try to come up with solutions rather than dwelling on the problem. 10. Figure out what triggers your anxiety. Keep a journal and make note of anxious moments and the events surrounding them. This will help you identify triggers you can avoid or even eliminate. 11. Talk to someone. Let a trusted friend, family member or even trained professional know that you are feeling overwhelmed and anxious. Verbalize what you are feeling and why.  12. Volunteer. If your anxiety is triggered by a crisis on a large scale, become an advocate and work to resolve the problem that is causing you unease. Anxiety is often unwelcome and can become overwhelming. If not kept in check, it can become a disorder that could require medical treatment. However, if you take the time to care for yourself and avoid the triggers that  make you anxious, you will be able to find moments of relaxation and clarity that make your life much more enjoyable.

## 2015-10-24 NOTE — Progress Notes (Signed)
BP 112/68   Pulse 94   Temp 99.4 F (37.4 C) (Oral)   Resp 16   Wt 264 lb (119.7 kg)   LMP 05/19/2013   SpO2 97%   BMI 41.35 kg/m    Subjective:    Patient ID: [name] Golden, female    DOB: 1975/11/02, 40 y.o.   MRN: [id number]  HPI: [name] Golden is a 40 y.o. female  Chief Complaint  Patient presents with  . Hospitalization Follow-up    URI  . Urinary Incontinence   Patient is here for hospital follow-up She was admitted on Sept 21st with wheezing and bronchitis, discharged the next day;  She had a normal CXR; treated with IV steroids, antibiotics, and nebulizers Sent home on antibiotics and steroid taper Still wheezing some, finished prednisone a few days ago She uses the nicotine patches to help with smoking cessation attempts She thinks she would feel better if she quit smoking Just with anxiety Husband smoking at home She didn't sleep at all last night She fasted today in case labs needed She had unprotected intimacy in July and would like to be checked; no pelvic pain, no discharge  Depression screen [facility] 2/9 10/24/2015 07/06/2015  Decreased Interest 0 1  Down, Depressed, Hopeless 0 1  PHQ - 2 Score 0 2  Altered sleeping - 1  Tired, decreased energy - 1  Change in appetite - 1  Feeling bad or failure about yourself  - 0  Trouble concentrating - 1  Moving slowly or fidgety/restless - 0  Suicidal thoughts - 1  PHQ-9 Score - 7  Difficult doing work/chores - Somewhat difficult   Relevant past medical, surgical, family and social history reviewed Past Medical History:  Diagnosis Date  . Anxiety   . Arthritis    right foot and knee  . Bipolar 1 disorder ([place])   . Bipolar 1 disorder, depressed, mild ([place]) 06/03/2015  . Depression   . Diabetes ([place])    diet controlled  . Endometriosis   . GERD (gastroesophageal reflux disease)   . Headache    sporadic - stress/allergies  . Heart murmur    as child  . High cholesterol   . History of DVT  (deep vein thrombosis) 2004   leg, neck, arm  . History of ovarian cyst   . History of pulmonary embolus (PE) 2004  . Hypertension   . Increased BMI (body mass index)   . Menopausal symptom   . Osteoporosis   . Painful intercourse   . Sleep apnea    CPAP  . Vitamin D deficiency    Past Surgical History:  Procedure Laterality Date  . ABDOMINAL HYSTERECTOMY    . APPENDECTOMY    . BREAST SURGERY    . COLONOSCOPY WITH PROPOFOL N/A 11/17/2014   Procedure: COLONOSCOPY WITH PROPOFOL;  Surgeon: Lucilla Lame, MD;  Location: [facility];  Service: Endoscopy;  Laterality: N/A;  CPAP Diabetic - diet controlled  . laparoscopic adhesions removal for endometriosis    . MEDIAL PARTIAL KNEE REPLACEMENT  02/13/2015   Dr. Jefm Bryant   Family History  Problem Relation Age of Onset  . Diabetes Mother   . Hypertension Mother   . Cancer Mother     cervical  . Hypertension Father   . Cancer Maternal Aunt     pancreatic  . Diabetes Paternal Aunt   . Cancer Paternal Aunt     breast  . Heart disease Paternal Uncle     hole  in heart  . Hypertension Maternal Grandmother   . Cancer Maternal Grandfather     lung  . Hypertension Maternal Grandfather   . Heart disease Paternal Grandmother   . Thyroid disease Paternal Grandmother   . Hypertension Paternal Grandmother   . Diabetes Paternal Grandfather   . Stroke Paternal Grandfather   . Hypertension Paternal Grandfather    Social History  Substance Use Topics  . Smoking status: Current Some Day Smoker    Packs/day: 0.50    Years: 23.00    Types: Cigarettes    Last attempt to quit: 12/03/2014  . Smokeless tobacco: Never Used     Comment: off and on smoker since age 37  . Alcohol use 0.0 oz/week     Comment: Occasional   Interim medical history since last visit reviewed. Allergies and medications reviewed  Review of Systems Per HPI unless specifically indicated above     Objective:    BP 112/68   Pulse 94   Temp 99.4 F (37.4  C) (Oral)   Resp 16   Wt 264 lb (119.7 kg)   LMP 05/19/2013   SpO2 97%   BMI 41.35 kg/m   Wt Readings from Last 3 Encounters:  10/24/15 264 lb (119.7 kg)  10/12/15 274 lb (124.3 kg)  07/06/15 272 lb (123.4 kg)    Physical Exam  Constitutional: She appears well-developed and well-nourished. No distress.  Weight loss of 10 pounds over last 2 weeks  HENT:  Head: Normocephalic and atraumatic.  Eyes: EOM are normal. No scleral icterus.  Neck: No thyromegaly present.  Cardiovascular: Normal rate, regular rhythm and normal heart sounds.   Pulmonary/Chest: Effort normal. No respiratory distress. She has wheezes (bilateral). She has no rales.  Abdominal: Soft. Bowel sounds are normal. She exhibits no distension.  Musculoskeletal: She exhibits no edema.  Neurological: She is alert. She exhibits normal muscle tone.  Skin: Skin is warm and dry. She is not diaphoretic. No pallor.  Psychiatric: She has a normal mood and affect. Her behavior is normal. Judgment and thought content normal. Her mood appears not anxious. Her affect is not blunt and not labile.  Good eye contact with examiner; became briefly tearful when discussing event from July, but otherwise full range of affect   Diabetic Foot Form - Detailed   Diabetic Foot Exam - detailed Diabetic Foot exam was performed with the following findings:  Yes 10/24/2015 12:05 PM  Visual Foot Exam completed.:  Yes  Are the toenails ingrown?:  No Normal Range of Motion:  Yes Pulse Foot Exam completed.:  Yes  Right Dorsalis Pedis:  Present Left Dorsalis Pedis:  Present  Sensory Foot Exam Completed.:  Yes Swelling:  No Semmes-Weinstein Monofilament Test R Site 1-Great Toe:  Pos L Site 1-Great Toe:  Pos  R Site 4:  Pos L Site 4:  Pos  R Site 5:  Pos L Site 5:  Pos       Results for orders placed or performed during the hospital encounter of 10/12/15  Comprehensive metabolic panel  Result Value Ref Range   Sodium 138 135 - 145 mmol/L    Potassium 3.4 (L) 3.5 - 5.1 mmol/L   Chloride 105 101 - 111 mmol/L   CO2 27 22 - 32 mmol/L   Glucose, Bld 97 65 - 99 mg/dL   BUN 10 6 - 20 mg/dL   Creatinine, Ser 0.78 0.44 - 1.00 mg/dL   Calcium 9.4 8.9 - 10.3 mg/dL   Total Protein 7.9  6.5 - 8.1 g/dL   Albumin 4.2 3.5 - 5.0 g/dL   AST 16 15 - 41 U/L   ALT 15 14 - 54 U/L   Alkaline Phosphatase 53 38 - 126 U/L   Total Bilirubin 0.7 0.3 - 1.2 mg/dL   GFR calc non Af Amer >60 >60 mL/min   GFR calc Af Amer >60 >60 mL/min   Anion gap 6 5 - 15  CBC with Differential  Result Value Ref Range   WBC 11.2 (H) 3.6 - 11.0 K/uL   RBC 5.30 (H) 3.80 - 5.20 MIL/uL   Hemoglobin 13.3 12.0 - 16.0 g/dL   HCT 40.5 35.0 - 47.0 %   MCV 76.3 (L) 80.0 - 100.0 fL   MCH 25.1 (L) 26.0 - 34.0 pg   MCHC 32.8 32.0 - 36.0 g/dL   RDW 14.9 (H) 11.5 - 14.5 %   Platelets 303 150 - 440 K/uL   Neutrophils Relative % 42 %   Neutro Abs 4.7 1.4 - 6.5 K/uL   Lymphocytes Relative 49 %   Lymphs Abs 5.5 (H) 1.0 - 3.6 K/uL   Monocytes Relative 7 %   Monocytes Absolute 0.7 0.2 - 0.9 K/uL   Eosinophils Relative 2 %   Eosinophils Absolute 0.2 0 - 0.7 K/uL   Basophils Relative 0 %   Basophils Absolute 0.1 0 - 0.1 K/uL  Basic metabolic panel  Result Value Ref Range   Sodium 137 135 - 145 mmol/L   Potassium 4.1 3.5 - 5.1 mmol/L   Chloride 108 101 - 111 mmol/L   CO2 24 22 - 32 mmol/L   Glucose, Bld 166 (H) 65 - 99 mg/dL   BUN 11 6 - 20 mg/dL   Creatinine, Ser 0.74 0.44 - 1.00 mg/dL   Calcium 9.0 8.9 - 10.3 mg/dL   GFR calc non Af Amer >60 >60 mL/min   GFR calc Af Amer >60 >60 mL/min   Anion gap 5 5 - 15  CBC  Result Value Ref Range   WBC 14.6 (H) 3.6 - 11.0 K/uL   RBC 5.27 (H) 3.80 - 5.20 MIL/uL   Hemoglobin 13.3 12.0 - 16.0 g/dL   HCT 40.2 35.0 - 47.0 %   MCV 76.2 (L) 80.0 - 100.0 fL   MCH 25.3 (L) 26.0 - 34.0 pg   MCHC 33.2 32.0 - 36.0 g/dL   RDW 15.0 (H) 11.5 - 14.5 %   Platelets 308 150 - 440 K/uL  Glucose, capillary  Result Value Ref Range    Glucose-Capillary 221 (H) 65 - 99 mg/dL   Comment 1 Notify RN   Glucose, capillary  Result Value Ref Range   Glucose-Capillary 109 (H) 65 - 99 mg/dL   Comment 1 Notify RN       Assessment & Plan:   Problem List Items Addressed This Visit      Respiratory   Acute bronchitis - Primary    Extend steroids        Endocrine   Diabetes mellitus type 2, uncontrolled (HCC)    Check A1c, foot exam by MD; time for eye exam, pt will call to schedule      Relevant Orders   COMPLETE METABOLIC PANEL WITH GFR   Hemoglobin A1c     Other   Vitamin D deficiency    Continue monthly vit D; check level      Relevant Orders   VITAMIN D 25 Hydroxy (Vit-D Deficiency, Fractures)   Tobacco abuse    Discussed,  encouraged cessation      Screen for STD (sexually transmitted disease)    labs      Relevant Orders   RPR   HIV antibody   GC/Chlamydia Probe Amp   Hepatitis panel, acute   Leukocytosis    Secondary to steroids and infection      Hyperlipidemia    Check lipids, today she is fasting; on statin      Relevant Orders   Lipid panel    Other Visit Diagnoses   None.      Follow up plan: No Follow-up on file.  An after-visit summary was printed and given to the patient at [facility].  Please see the patient instructions which may contain other information and recommendations beyond what is mentioned above in the assessment and plan.  Meds ordered this encounter  Medications  . predniSONE (DELTASONE) 10 MG tablet    Sig: Three pills by mouth daily x 2 days, then 2 pills daily x 2 days, then 1 pill daily x 2 days    Dispense:  12 tablet    Refill:  0    Orders Placed This Encounter  Procedures  . GC/Chlamydia Probe Amp  . RPR  . HIV antibody  . Lipid panel  . VITAMIN D 25 Hydroxy (Vit-D Deficiency, Fractures)  . COMPLETE METABOLIC PANEL WITH GFR  . Hepatitis panel, acute  . Hemoglobin A1c

## 2015-10-24 NOTE — Assessment & Plan Note (Signed)
Check A1c, foot exam by MD; time for eye exam, pt will call to schedule

## 2015-10-25 LAB — COMPLETE METABOLIC PANEL WITH GFR
ALT: 16 U/L (ref 6–29)
AST: 14 U/L (ref 10–30)
Albumin: 4 g/dL (ref 3.6–5.1)
Alkaline Phosphatase: 59 U/L (ref 33–115)
BILIRUBIN TOTAL: 0.4 mg/dL (ref 0.2–1.2)
BUN: 7 mg/dL (ref 7–25)
CHLORIDE: 105 mmol/L (ref 98–110)
CO2: 23 mmol/L (ref 20–31)
CREATININE: 0.73 mg/dL (ref 0.50–1.10)
Calcium: 9 mg/dL (ref 8.6–10.2)
GFR, Est African American: >89 mL/min (ref >=60)
GLUCOSE: 178 mg/dL — AB (ref 65–99)
Potassium: 4.1 mmol/L (ref 3.5–5.3)
SODIUM: 138 mmol/L (ref 135–146)
TOTAL PROTEIN: 6.9 g/dL (ref 6.1–8.1)

## 2015-10-25 LAB — HEPATITIS PANEL, ACUTE
HCV AB: NEGATIVE
HEP B C IGM: NONREACTIVE
Hep A IgM: NONREACTIVE
Hepatitis B Surface Ag: NEGATIVE

## 2015-10-25 LAB — GC/CHLAMYDIA PROBE AMP
CT PROBE, AMP APTIMA: NOT DETECTED
GC Probe RNA: NOT DETECTED

## 2015-10-25 LAB — LIPID PANEL
CHOL/HDL RATIO: 5.1 ratio — AB (ref <=5.0)
Cholesterol: 148 mg/dL (ref 125–200)
HDL: 29 mg/dL — ABNORMAL LOW (ref >=46)
LDL CALC: 96 mg/dL (ref <130)
Triglycerides: 115 mg/dL (ref <150)
VLDL: 23 mg/dL (ref <30)

## 2015-10-25 LAB — HEMOGLOBIN A1C
HEMOGLOBIN A1C: 7.3 % — AB (ref <5.7)
MEAN PLASMA GLUCOSE: 163 mg/dL

## 2015-10-25 LAB — VITAMIN D 25 HYDROXY (VIT D DEFICIENCY, FRACTURES): VIT D 25 HYDROXY: 31 ng/mL (ref 30–100)

## 2015-10-25 LAB — HIV ANTIBODY (ROUTINE TESTING W REFLEX): HIV 1&2 Ab, 4th Generation: NONREACTIVE

## 2015-10-25 LAB — RPR

## 2015-10-26 ENCOUNTER — Telehealth: Payer: Self-pay | Admitting: Family Medicine

## 2015-10-26 NOTE — Telephone Encounter (Signed)
PT returning your call from yesterday about her results. [phone]

## 2015-11-10 ENCOUNTER — Telehealth: Payer: Self-pay

## 2015-11-10 NOTE — Telephone Encounter (Signed)
Dr. lada Received a fax stating her referral to Dermatology  Canceled. Call the pt to see if she still needs the and or wants the referral. No answer and no voicemail set up. Well try to call the Pt again at the end of the day.

## 2015-11-10 NOTE — Telephone Encounter (Signed)
Dr. Hilbert Odor asked me to call this Pt regarding her canceled Dermatology appointment; Called the Pt twice, no Voicemail set up therefore I will try again Monday.

## 2015-11-11 NOTE — Telephone Encounter (Signed)
Thank you :)

## 2015-11-13 NOTE — Telephone Encounter (Signed)
Called Pt regarding her cancel Dermatology appointment. Left voicemail to give me a call back.

## 2015-11-15 ENCOUNTER — Telehealth: Payer: Self-pay

## 2015-11-15 NOTE — Telephone Encounter (Signed)
Dr. Sanda Klein I was able to get in contact with the pt. She needs the appointment but due to her having a mental  break down she was un bale to get to the appointment. Once she becomes better she will give them a call and reschedule.

## 2015-12-13 ENCOUNTER — Other Ambulatory Visit: Payer: Self-pay | Admitting: Family Medicine

## 2015-12-13 NOTE — Telephone Encounter (Signed)
Sgpt and lipids reviewed; approved

## 2016-01-30 ENCOUNTER — Other Ambulatory Visit: Payer: Self-pay | Admitting: Family Medicine

## 2016-01-30 ENCOUNTER — Other Ambulatory Visit: Payer: Self-pay | Admitting: Podiatry

## 2016-01-31 NOTE — Telephone Encounter (Signed)
Pt needs an appt prior to future refills. 

## 2016-04-12 ENCOUNTER — Telehealth: Payer: Self-pay | Admitting: Family Medicine

## 2016-04-12 NOTE — Telephone Encounter (Signed)
Pt has moved out of town and is no longer coming here as a patient, however she does wish for Dr Sanda Klein to call her about a plan that the 2 had in motion. Pt is requesting for Dr Sanda Klein to call her back. [phone]

## 2016-04-12 NOTE — Telephone Encounter (Signed)
I returned her call She is having problems with her hernia; thinks she has a hernia, saw one doctor and they don't think she has a hernia She has symptoms that come and go, cramping and gnawing pain in her side She is losing weight, 30 pounds, more active, but not sure about losing this much weight; I encouraged her to get seen and have labs done, be evaluated She moved to [place], [place] Has some anxiety with the move I urged her to seek out a new doctor there, contact hospital or medical board; find [facility] fam med or IM Call me if I can help facilitate transition with new doctor and we'll be glad to send records

## 2017-08-19 IMAGING — CT CT ABD-PELV W/ CM
2 of 5 series · 17 of 46 positions shown, 19 images · IV contrast (omnipaque)
Comparison: 10/29/2012

CLINICAL DATA: Abdominal pain

EXAM:
CT ABDOMEN AND PELVIS WITH CONTRAST
TECHNIQUE: Multidetector CT imaging of the abdomen and pelvis was performed
using the standard protocol following bolus administration of
intravenous contrast.
CONTRAST:  125mL OMNIPAQUE IOHEXOL 300 MG/ML  SOLN

[Series 8: axial soft tissue thins · axial · 0.95mm/px · z∈[-788,-371]mm · 14 of 306 slices shown, 16 images]
[im 14/306  soft-tissue]
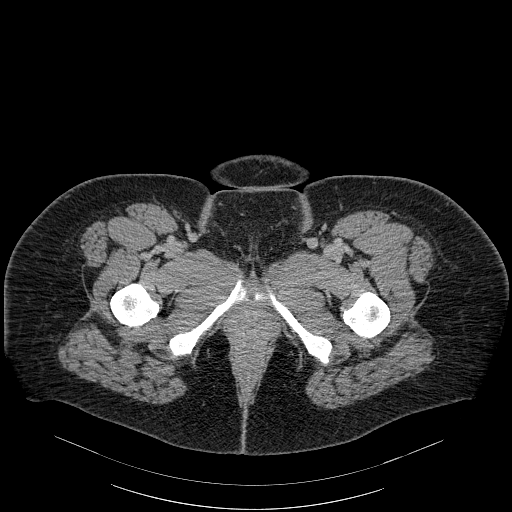
[im 14/306  bone]
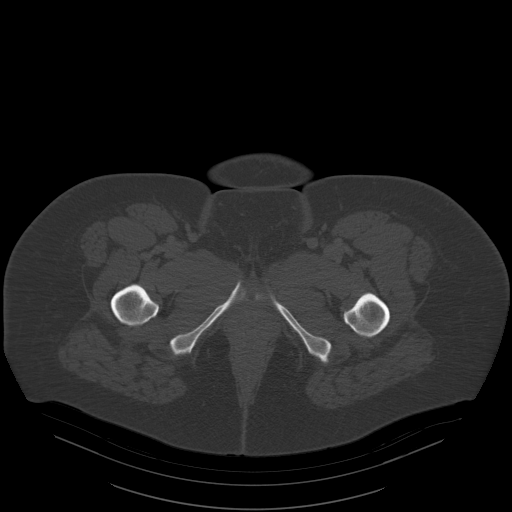
[im 42/306  soft-tissue]
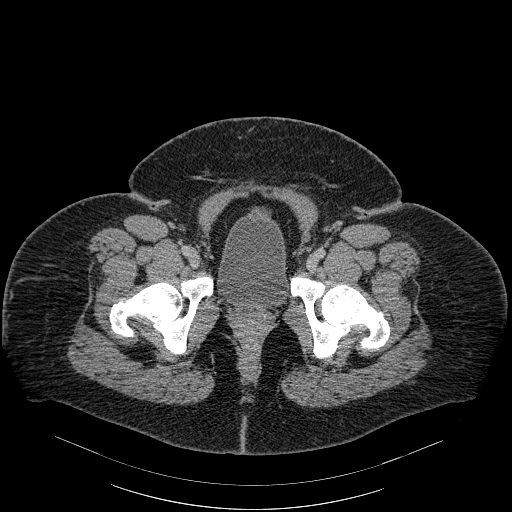
[im 56/306  soft-tissue]
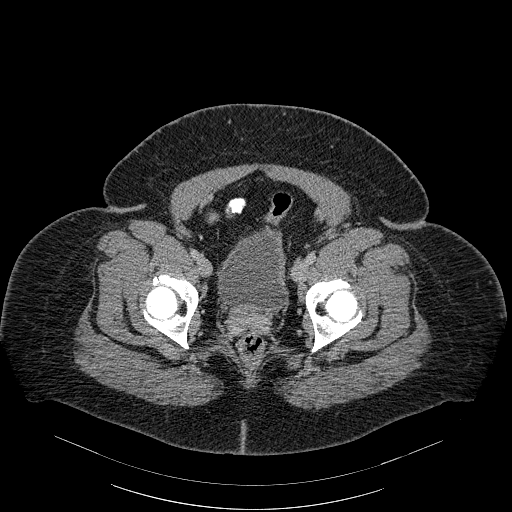
[im 84/306  soft-tissue]
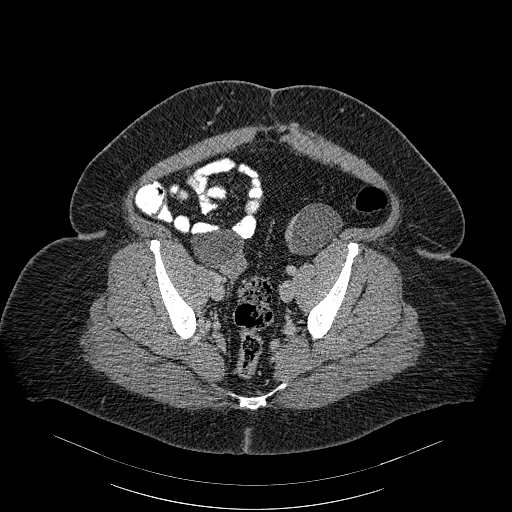
[im 98/306  soft-tissue]
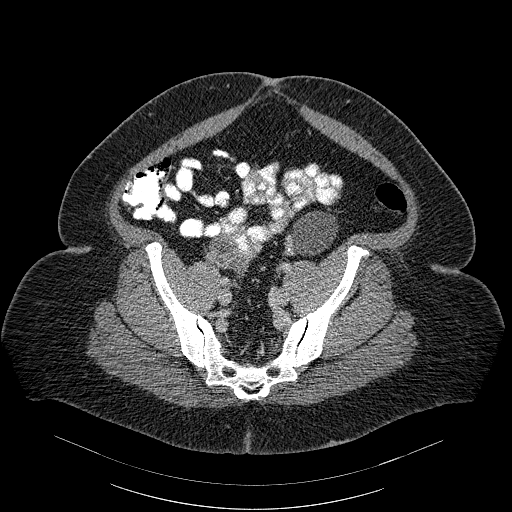
[im 125/306  soft-tissue]
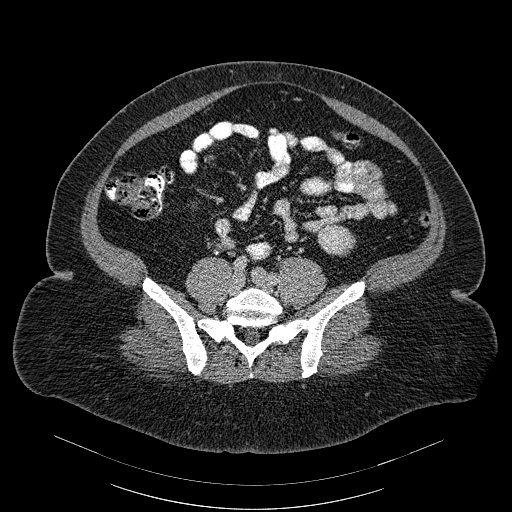
[im 139/306  soft-tissue]
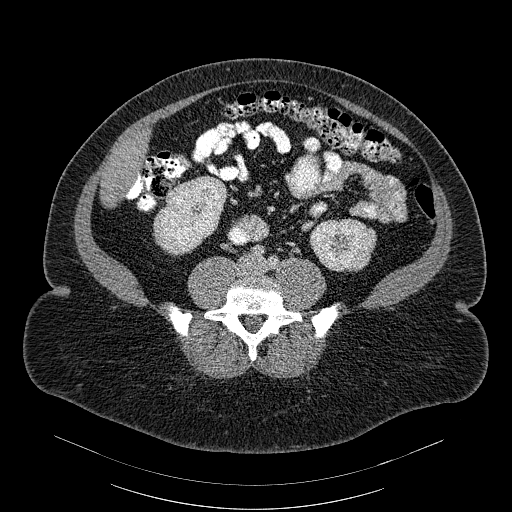
[im 167/306  soft-tissue]
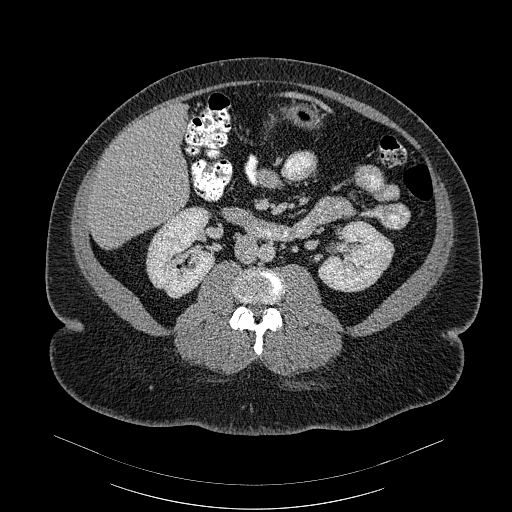
[im 181/306  soft-tissue]
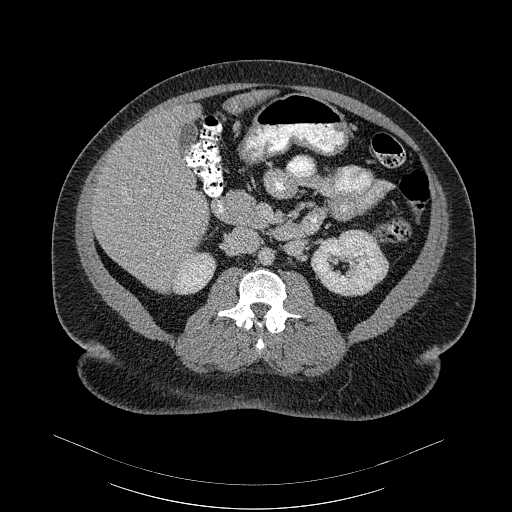
[im 181/306  bone]
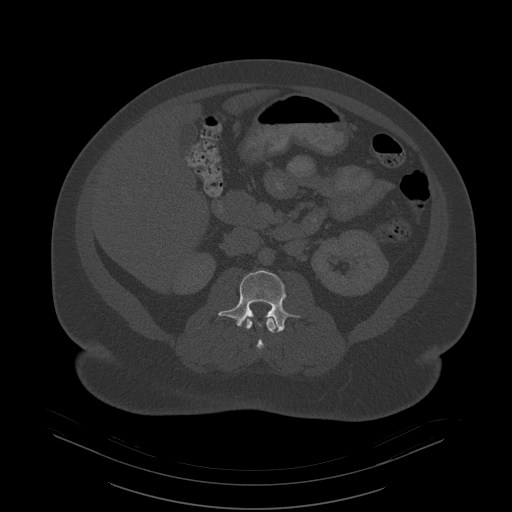
[im 208/306  soft-tissue]
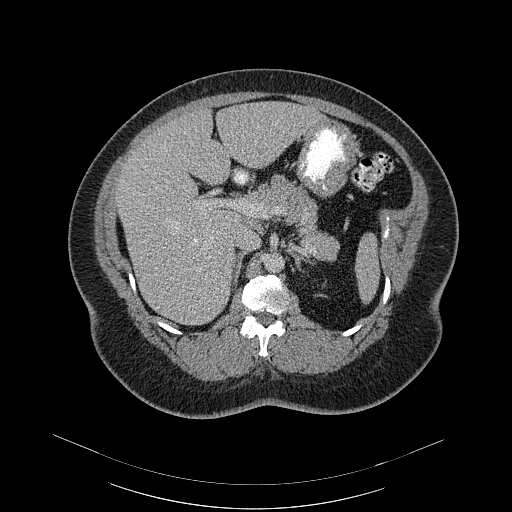
[im 222/306  soft-tissue]
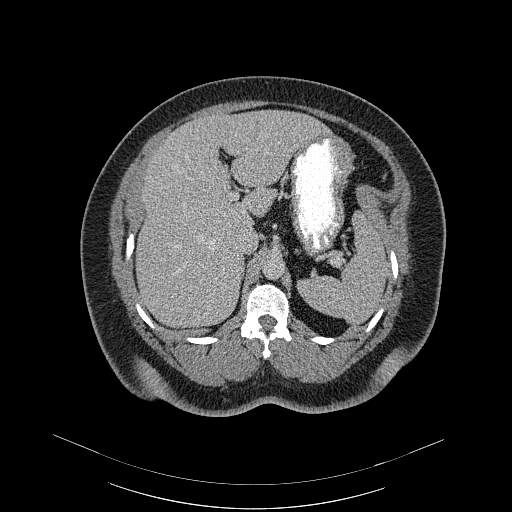
[im 250/306  soft-tissue]
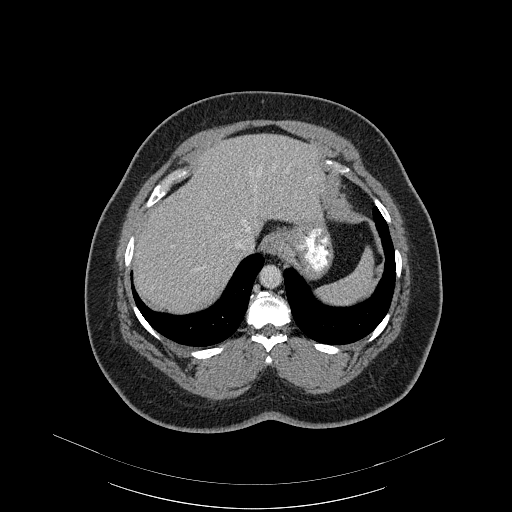
[im 264/306  soft-tissue]
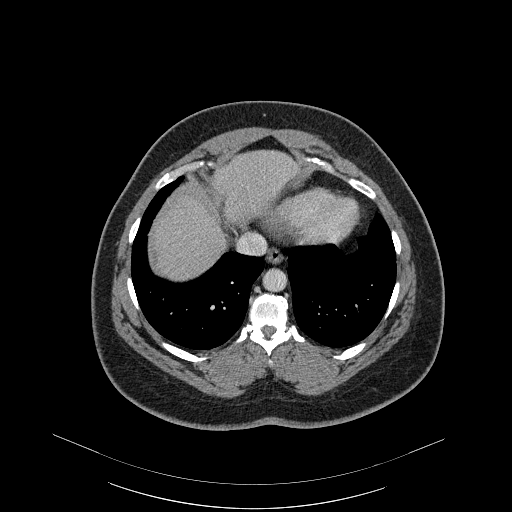
[im 292/306  soft-tissue]
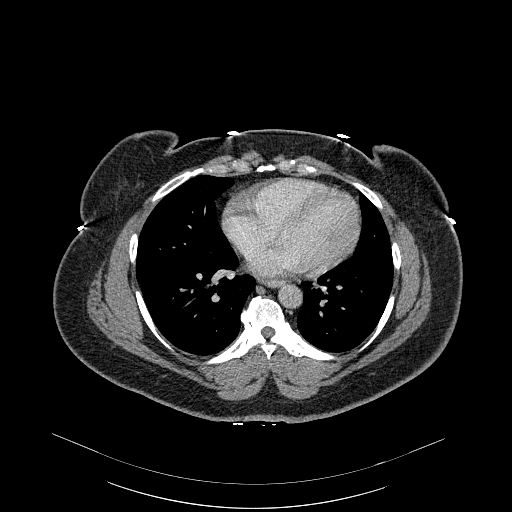

[Series 604: coronal · coronal · 0.95mm/px · 3 of 119 slices shown]
[im 40/119  soft-tissue]
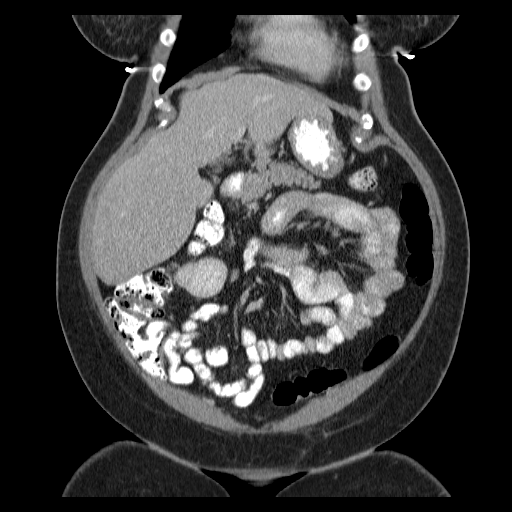
[im 53/119  soft-tissue]
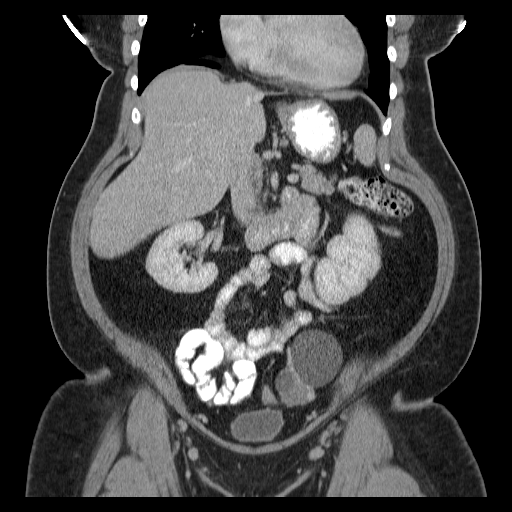
[im 66/119  soft-tissue]
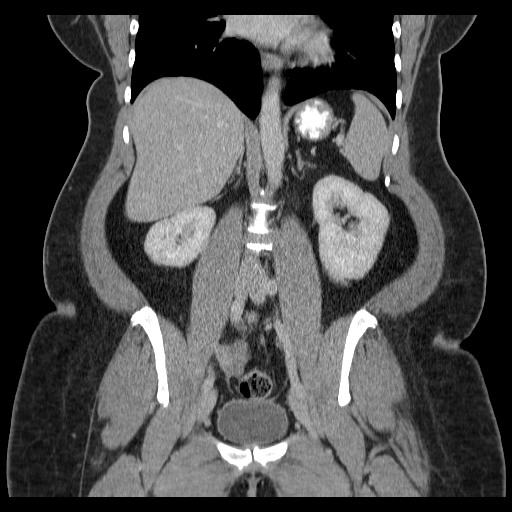

[17 of 46 positions shown; findings below may reference images not displayed]

FINDINGS: Lung bases are unremarkable. Sagittal images of the spine shows
degenerative changes thoracolumbar spine. Subtle lesion in left
hepatic lobe anteriorly lateral segment measures about 3.5 cm. This
is stable in size in appearance from prior exam. Gallbladder is
contracted without evidence of calcified gallstones. The pancreas,
spleen and adrenal glands are unremarkable. No aortic aneurysm.

No small bowel obstruction.  No ascites or free air.  No adenopathy.

Kidneys are symmetrical in size and enhancement. No hydronephrosis
or hydroureter.

There is no pericecal inflammation. The terminal ileum is
unremarkable. The patient is status post appendectomy. Again noted a
right ovarian cyst measures 4.3 cm. There is a new left ovarian cyst
measures 5.6 by 3.9 cm. This is a simple cyst. A second cyst/
follicle within left ovary measures 2 cm. Urinary bladder is
unremarkable. The uterus is surgically absent. Moderate stool noted
in distal sigmoid colon. No destructive bony lesions are noted
within pelvis. Degenerative changes bilateral SI joints.
IMPRESSION: 1. Stable subtle lesion in lateral segment of left hepatic lobe.
2. No hydronephrosis or hydroureter.
3. No small bowel obstruction. No pericecal inflammation. Status
post appendectomy.
4. Again noted a right ovarian cyst measures 4.3 cm. There is a new
left ovarian cyst measures 5.6 by 3.9 cm. This is a simple cyst. A
second cyst/ follicle within left ovary measures 2 cm.
5. Surgically absent uterus.
